# Patient Record
Sex: Male | Born: 1950 | Race: Black or African American | Hispanic: No | Marital: Married | Smoking: Current every day smoker
Health system: Southern US, Community
[De-identification: ages and names within clinical notes are randomized; demographics above are authoritative.]

## PROBLEM LIST (undated history)

## (undated) DIAGNOSIS — J45909 Unspecified asthma, uncomplicated: Secondary | ICD-10-CM

## (undated) DIAGNOSIS — I10 Essential (primary) hypertension: Secondary | ICD-10-CM

## (undated) DIAGNOSIS — I639 Cerebral infarction, unspecified: Secondary | ICD-10-CM

## (undated) DIAGNOSIS — J449 Chronic obstructive pulmonary disease, unspecified: Secondary | ICD-10-CM

## (undated) HISTORY — PX: LEG SURGERY: SHX1003

---

## 2006-02-17 ENCOUNTER — Emergency Department: Payer: Self-pay | Admitting: Emergency Medicine

## 2008-08-25 ENCOUNTER — Emergency Department: Payer: Self-pay | Admitting: Emergency Medicine

## 2009-12-29 ENCOUNTER — Inpatient Hospital Stay (HOSPITAL_COMMUNITY)
Admission: EM | Admit: 2009-12-29 | Discharge: 2010-01-27 | Payer: Self-pay | Source: Home / Self Care | Admitting: Emergency Medicine

## 2009-12-30 ENCOUNTER — Ambulatory Visit: Admission: RE | Admit: 2009-12-30 | Discharge: 2009-12-30 | Payer: Self-pay | Admitting: Neurology

## 2009-12-30 ENCOUNTER — Ambulatory Visit: Payer: Self-pay | Admitting: Critical Care Medicine

## 2009-12-31 ENCOUNTER — Ambulatory Visit: Payer: Self-pay | Admitting: Vascular Surgery

## 2009-12-31 ENCOUNTER — Encounter (INDEPENDENT_AMBULATORY_CARE_PROVIDER_SITE_OTHER): Payer: Self-pay | Admitting: Internal Medicine

## 2010-01-02 ENCOUNTER — Ambulatory Visit: Payer: Self-pay | Admitting: Physical Medicine & Rehabilitation

## 2010-05-21 ENCOUNTER — Emergency Department: Payer: Self-pay | Admitting: Emergency Medicine

## 2010-06-01 ENCOUNTER — Emergency Department: Payer: Self-pay | Admitting: Emergency Medicine

## 2010-06-15 LAB — CBC
HCT: 32.6 % — ABNORMAL LOW (ref 39.0–52.0)
HCT: 40.4 % (ref 39.0–52.0)
Hemoglobin: 11.1 g/dL — ABNORMAL LOW (ref 13.0–17.0)
Hemoglobin: 11.4 g/dL — ABNORMAL LOW (ref 13.0–17.0)
Hemoglobin: 12.7 g/dL — ABNORMAL LOW (ref 13.0–17.0)
Hemoglobin: 14.1 g/dL (ref 13.0–17.0)
Hemoglobin: 14 g/dL (ref 13.0–17.0)
MCH: 31 pg (ref 26.0–34.0)
MCH: 31.3 pg (ref 26.0–34.0)
MCH: 31.4 pg (ref 26.0–34.0)
MCH: 31.9 pg (ref 26.0–34.0)
MCH: 32 pg (ref 26.0–34.0)
MCH: 31.6 pg (ref 26.0–34.0)
MCHC: 33.2 g/dL (ref 30.0–36.0)
MCHC: 34 g/dL (ref 30.0–36.0)
MCHC: 34 g/dL (ref 30.0–36.0)
MCHC: 34.3 g/dL (ref 30.0–36.0)
MCHC: 34.9 g/dL (ref 30.0–36.0)
MCV: 91.2 fL (ref 78.0–100.0)
MCV: 92.1 fL (ref 78.0–100.0)
Platelets: 174 10*3/uL (ref 150–400)
Platelets: 264 10*3/uL (ref 150–400)
Platelets: 289 10*3/uL (ref 150–400)
Platelets: 189 10*3/uL (ref 150–400)
RBC: 3.99 MIL/uL — ABNORMAL LOW (ref 4.22–5.81)
RBC: 5.26 MIL/uL (ref 4.22–5.81)
RBC: 4.43 MIL/uL (ref 4.22–5.81)
RDW: 12.7 % (ref 11.5–15.5)
RDW: 13.3 % (ref 11.5–15.5)
RDW: 14 % (ref 11.5–15.5)
WBC: 18.2 10*3/uL — ABNORMAL HIGH (ref 4.0–10.5)
WBC: 9.2 10*3/uL (ref 4.0–10.5)

## 2010-06-15 LAB — COMPREHENSIVE METABOLIC PANEL
ALT: 18 U/L (ref 0–53)
ALT: 19 U/L (ref 0–53)
AST: 23 U/L (ref 0–37)
AST: 24 U/L (ref 0–37)
AST: 26 U/L (ref 0–37)
Albumin: 3.7 g/dL (ref 3.5–5.2)
Albumin: 3.9 g/dL (ref 3.5–5.2)
Alkaline Phosphatase: 67 U/L (ref 39–117)
CO2: 22 mEq/L (ref 19–32)
CO2: 28 mEq/L (ref 19–32)
Calcium: 8.7 mg/dL (ref 8.4–10.5)
Chloride: 109 mEq/L (ref 96–112)
Chloride: 113 mEq/L — ABNORMAL HIGH (ref 96–112)
Creatinine, Ser: 1.06 mg/dL (ref 0.4–1.5)
Creatinine, Ser: 1.07 mg/dL (ref 0.4–1.5)
Creatinine, Ser: 1.31 mg/dL (ref 0.4–1.5)
GFR calc Af Amer: >60 mL/min (ref >60)
GFR calc Af Amer: >60 mL/min (ref >60)
GFR calc non Af Amer: 56 mL/min — ABNORMAL LOW (ref >60)
GFR calc non Af Amer: >60 mL/min (ref >60)
Glucose, Bld: 87 mg/dL (ref 70–99)
Potassium: 3.6 mEq/L (ref 3.5–5.1)
Potassium: 4.2 mEq/L (ref 3.5–5.1)
Sodium: 141 mEq/L (ref 135–145)
Sodium: 143 mEq/L (ref 135–145)
Total Bilirubin: 0.7 mg/dL (ref 0.3–1.2)
Total Bilirubin: 1 mg/dL (ref 0.3–1.2)
Total Protein: 6.4 g/dL (ref 6.0–8.3)

## 2010-06-15 LAB — URINE CULTURE: Culture: NO GROWTH

## 2010-06-15 LAB — SODIUM
Sodium: 142 mEq/L (ref 135–145)
Sodium: 143 mEq/L (ref 135–145)
Sodium: 146 mEq/L — ABNORMAL HIGH (ref 135–145)
Sodium: 149 mEq/L — ABNORMAL HIGH (ref 135–145)
Sodium: 150 mEq/L — ABNORMAL HIGH (ref 135–145)
Sodium: 151 mEq/L — ABNORMAL HIGH (ref 135–145)
Sodium: 152 mEq/L — ABNORMAL HIGH (ref 135–145)
Sodium: 154 mEq/L — ABNORMAL HIGH (ref 135–145)
Sodium: 154 mEq/L — ABNORMAL HIGH (ref 135–145)
Sodium: 155 mEq/L — ABNORMAL HIGH (ref 135–145)
Sodium: 155 mEq/L — ABNORMAL HIGH (ref 135–145)
Sodium: 155 mEq/L — ABNORMAL HIGH (ref 135–145)
Sodium: 158 mEq/L — ABNORMAL HIGH (ref 135–145)
Sodium: 162 mEq/L (ref 135–145)
Sodium: 164 mEq/L (ref 135–145)

## 2010-06-15 LAB — GLUCOSE, CAPILLARY
Glucose-Capillary: 108 mg/dL — ABNORMAL HIGH (ref 70–99)
Glucose-Capillary: 111 mg/dL — ABNORMAL HIGH (ref 70–99)
Glucose-Capillary: 111 mg/dL — ABNORMAL HIGH (ref 70–99)
Glucose-Capillary: 112 mg/dL — ABNORMAL HIGH (ref 70–99)
Glucose-Capillary: 113 mg/dL — ABNORMAL HIGH (ref 70–99)
Glucose-Capillary: 118 mg/dL — ABNORMAL HIGH (ref 70–99)
Glucose-Capillary: 121 mg/dL — ABNORMAL HIGH (ref 70–99)
Glucose-Capillary: 123 mg/dL — ABNORMAL HIGH (ref 70–99)
Glucose-Capillary: 123 mg/dL — ABNORMAL HIGH (ref 70–99)
Glucose-Capillary: 124 mg/dL — ABNORMAL HIGH (ref 70–99)
Glucose-Capillary: 125 mg/dL — ABNORMAL HIGH (ref 70–99)
Glucose-Capillary: 128 mg/dL — ABNORMAL HIGH (ref 70–99)
Glucose-Capillary: 129 mg/dL — ABNORMAL HIGH (ref 70–99)
Glucose-Capillary: 132 mg/dL — ABNORMAL HIGH (ref 70–99)
Glucose-Capillary: 133 mg/dL — ABNORMAL HIGH (ref 70–99)
Glucose-Capillary: 134 mg/dL — ABNORMAL HIGH (ref 70–99)
Glucose-Capillary: 134 mg/dL — ABNORMAL HIGH (ref 70–99)
Glucose-Capillary: 143 mg/dL — ABNORMAL HIGH (ref 70–99)
Glucose-Capillary: 145 mg/dL — ABNORMAL HIGH (ref 70–99)
Glucose-Capillary: 150 mg/dL — ABNORMAL HIGH (ref 70–99)
Glucose-Capillary: 150 mg/dL — ABNORMAL HIGH (ref 70–99)
Glucose-Capillary: 154 mg/dL — ABNORMAL HIGH (ref 70–99)
Glucose-Capillary: 90 mg/dL (ref 70–99)
Glucose-Capillary: 141 mg/dL — ABNORMAL HIGH (ref 70–99)
Glucose-Capillary: 89 mg/dL (ref 70–99)
Glucose-Capillary: 94 mg/dL (ref 70–99)
Glucose-Capillary: 94 mg/dL (ref 70–99)

## 2010-06-15 LAB — BASIC METABOLIC PANEL
BUN: 22 mg/dL (ref 6–23)
CO2: 22 mEq/L (ref 19–32)
CO2: 23 mEq/L (ref 19–32)
CO2: 23 mEq/L (ref 19–32)
CO2: 23 mEq/L (ref 19–32)
CO2: 25 mEq/L (ref 19–32)
Calcium: 8 mg/dL — ABNORMAL LOW (ref 8.4–10.5)
Calcium: 8.2 mg/dL — ABNORMAL LOW (ref 8.4–10.5)
Calcium: 8.3 mg/dL — ABNORMAL LOW (ref 8.4–10.5)
Calcium: 8.3 mg/dL — ABNORMAL LOW (ref 8.4–10.5)
Calcium: 8.5 mg/dL (ref 8.4–10.5)
Calcium: 8.5 mg/dL (ref 8.4–10.5)
Chloride: 122 mEq/L — ABNORMAL HIGH (ref 96–112)
Chloride: 126 mEq/L — ABNORMAL HIGH (ref 96–112)
Creatinine, Ser: 1.01 mg/dL (ref 0.4–1.5)
Creatinine, Ser: 1.05 mg/dL (ref 0.4–1.5)
Creatinine, Ser: 1.34 mg/dL (ref 0.4–1.5)
GFR calc Af Amer: >60 mL/min (ref >60)
GFR calc Af Amer: >60 mL/min (ref >60)
GFR calc Af Amer: >60 mL/min (ref >60)
GFR calc non Af Amer: >60 mL/min (ref >60)
GFR calc non Af Amer: >60 mL/min (ref >60)
Glucose, Bld: 126 mg/dL — ABNORMAL HIGH (ref 70–99)
Glucose, Bld: 134 mg/dL — ABNORMAL HIGH (ref 70–99)
Glucose, Bld: 140 mg/dL — ABNORMAL HIGH (ref 70–99)
Glucose, Bld: 164 mg/dL — ABNORMAL HIGH (ref 70–99)
Potassium: 3.1 mEq/L — ABNORMAL LOW (ref 3.5–5.1)
Sodium: 145 mEq/L (ref 135–145)
Sodium: 152 mEq/L — ABNORMAL HIGH (ref 135–145)
Sodium: 157 mEq/L — ABNORMAL HIGH (ref 135–145)
Sodium: 157 mEq/L — ABNORMAL HIGH (ref 135–145)

## 2010-06-15 LAB — BLOOD GAS, ARTERIAL
Acid-base deficit: 1.2 mmol/L (ref 0.0–2.0)
Bicarbonate: 22.1 mEq/L (ref 20.0–24.0)
Drawn by: 296031
FIO2: 35 %
O2 Saturation: 95.9 %
TCO2: 23.1 mmol/L (ref 0–100)
pCO2 arterial: 31.9 mmHg — ABNORMAL LOW (ref 35.0–45.0)
pO2, Arterial: 75.9 mmHg — ABNORMAL LOW (ref 80.0–100.0)

## 2010-06-15 LAB — DIFFERENTIAL
Basophils Absolute: 0.2 10*3/uL — ABNORMAL HIGH (ref 0.0–0.1)
Basophils Absolute: 0 10*3/uL (ref 0.0–0.1)
Basophils Relative: 0 % (ref 0–1)
Eosinophils Absolute: 0.3 10*3/uL (ref 0.0–0.7)
Eosinophils Relative: 1 % (ref 0–5)
Eosinophils Relative: 1 % (ref 0–5)
Eosinophils Relative: 3 % (ref 0–5)
Lymphs Abs: 1.8 10*3/uL (ref 0.7–4.0)
Monocytes Absolute: 1.4 10*3/uL — ABNORMAL HIGH (ref 0.1–1.0)
Monocytes Absolute: 1.5 10*3/uL — ABNORMAL HIGH (ref 0.1–1.0)
Monocytes Relative: 8 % (ref 3–12)
Neutrophils Relative %: 72 % (ref 43–77)
Neutrophils Relative %: 58 % (ref 43–77)
WBC Morphology: INCREASED

## 2010-06-15 LAB — URINALYSIS, ROUTINE W REFLEX MICROSCOPIC
Leukocytes, UA: NEGATIVE
Nitrite: NEGATIVE
Nitrite: NEGATIVE
Protein, ur: NEGATIVE mg/dL
Specific Gravity, Urine: 1.012 (ref 1.005–1.030)
Specific Gravity, Urine: 1.007 (ref 1.005–1.030)
Urobilinogen, UA: 0.2 mg/dL (ref 0.0–1.0)
pH: 7.5 (ref 5.0–8.0)

## 2010-06-15 LAB — POCT I-STAT 3, ART BLOOD GAS (G3+)
Acid-Base Excess: 1 mmol/L (ref 0.0–2.0)
Bicarbonate: 21.9 mEq/L (ref 20.0–24.0)
pCO2 arterial: 27.3 mmHg — ABNORMAL LOW (ref 35.0–45.0)
pH, Arterial: 7.513 — ABNORMAL HIGH (ref 7.350–7.450)
pO2, Arterial: 79 mmHg — ABNORMAL LOW (ref 80.0–100.0)

## 2010-06-15 LAB — LIPID PANEL
HDL: 48 mg/dL (ref >39)
Total CHOL/HDL Ratio: 4 RATIO
Triglycerides: 146 mg/dL (ref <150)

## 2010-06-15 LAB — PATHOLOGIST SMEAR REVIEW

## 2010-06-15 LAB — MRSA PCR SCREENING: MRSA by PCR: NEGATIVE

## 2010-06-15 LAB — CULTURE, BLOOD (ROUTINE X 2)
Culture  Setup Time: 201110021150
Culture: NO GROWTH
Culture: NO GROWTH
Culture: NO GROWTH

## 2010-06-15 LAB — VITAMIN B12: Vitamin B-12: 731 pg/mL (ref 211–911)

## 2010-06-15 LAB — RAPID URINE DRUG SCREEN, HOSP PERFORMED
Amphetamines: NOT DETECTED
Benzodiazepines: NOT DETECTED
Tetrahydrocannabinol: NOT DETECTED

## 2010-06-15 LAB — URINE MICROSCOPIC-ADD ON

## 2010-06-15 LAB — CK TOTAL AND CKMB (NOT AT ARMC)
CK, MB: 4.9 ng/mL — ABNORMAL HIGH (ref 0.3–4.0)
Total CK: 346 U/L — ABNORMAL HIGH (ref 7–232)

## 2010-06-15 LAB — PROTIME-INR
INR: 1.04 (ref 0.00–1.49)
Prothrombin Time: 13.8 seconds (ref 11.6–15.2)

## 2010-06-15 LAB — MAGNESIUM: Magnesium: 2.2 mg/dL (ref 1.5–2.5)

## 2010-06-15 LAB — PHOSPHORUS: Phosphorus: 1.3 mg/dL — ABNORMAL LOW (ref 2.3–4.6)

## 2010-06-17 ENCOUNTER — Emergency Department: Payer: Self-pay | Admitting: Emergency Medicine

## 2010-07-13 ENCOUNTER — Emergency Department: Payer: Self-pay | Admitting: Emergency Medicine

## 2010-07-20 ENCOUNTER — Emergency Department: Payer: Self-pay | Admitting: Emergency Medicine

## 2010-08-14 ENCOUNTER — Emergency Department: Payer: Self-pay | Admitting: Emergency Medicine

## 2010-08-28 ENCOUNTER — Emergency Department: Payer: Self-pay | Admitting: Emergency Medicine

## 2010-09-16 ENCOUNTER — Emergency Department: Payer: Self-pay | Admitting: Emergency Medicine

## 2010-10-03 ENCOUNTER — Emergency Department: Payer: Self-pay | Admitting: Unknown Physician Specialty

## 2010-11-03 ENCOUNTER — Emergency Department: Payer: Self-pay | Admitting: Unknown Physician Specialty

## 2010-12-01 ENCOUNTER — Emergency Department: Payer: Self-pay | Admitting: Emergency Medicine

## 2010-12-23 ENCOUNTER — Emergency Department: Payer: Self-pay | Admitting: Emergency Medicine

## 2011-01-05 ENCOUNTER — Emergency Department: Payer: Self-pay | Admitting: Emergency Medicine

## 2011-10-07 IMAGING — CR DG CHEST 1V PORT
1 series · 1 of 1 positions shown · non-contrast
Comparison: none

REASON FOR EXAM: sob
COMMENTS:

PROCEDURE:     DXR - DXR PORTABLE CHEST SINGLE VIEW  - June 01, 2010 [DATE]
RESULT:     Comparison is made to the prior exam of 05/21/2010.
The lung fields are clear. The heart, mediastinal and osseous structures
show no acute changes. Monitoring electrodes are present.

[view not recorded]
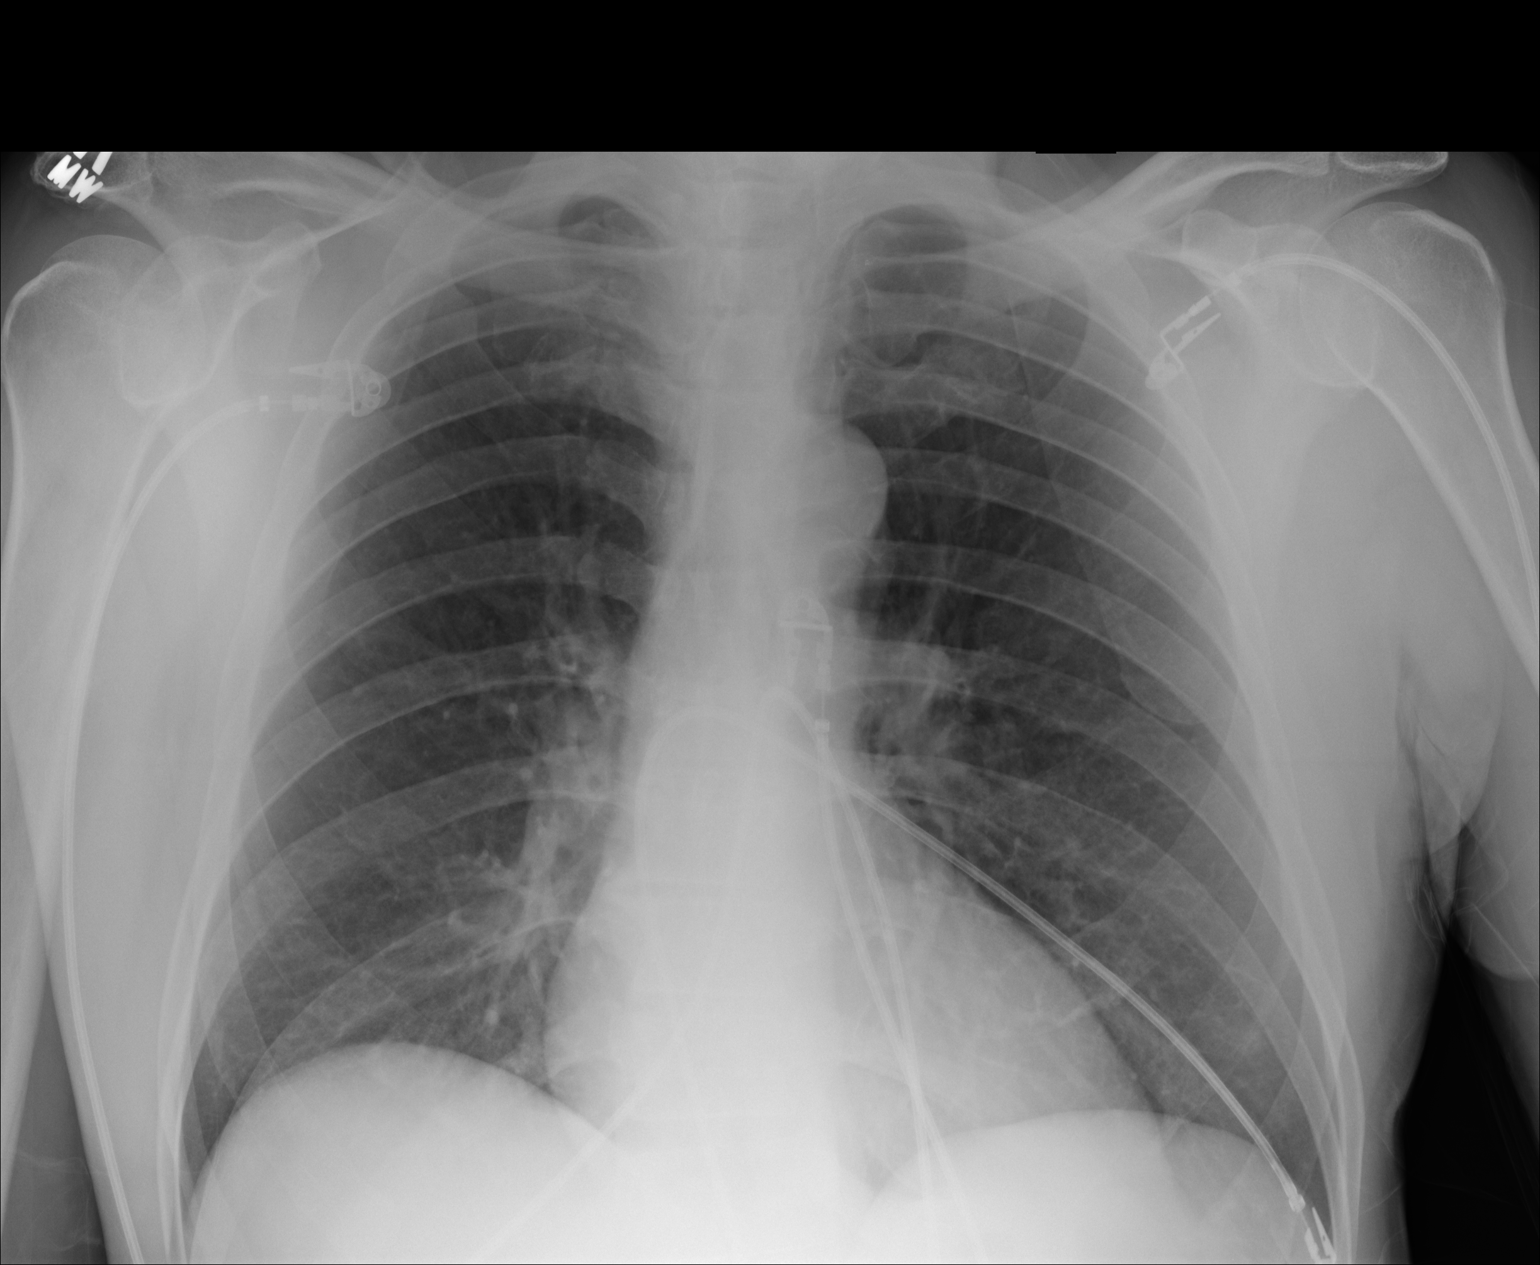

[1 of 1 positions shown; findings below may reference images not displayed]

IMPRESSION: No acute changes are identified.

## 2011-10-23 IMAGING — CR DG CHEST 1V PORT
1 series · 1 of 1 positions shown · non-contrast
Comparison: none

REASON FOR EXAM: sobb
COMMENTS:

[view not recorded]
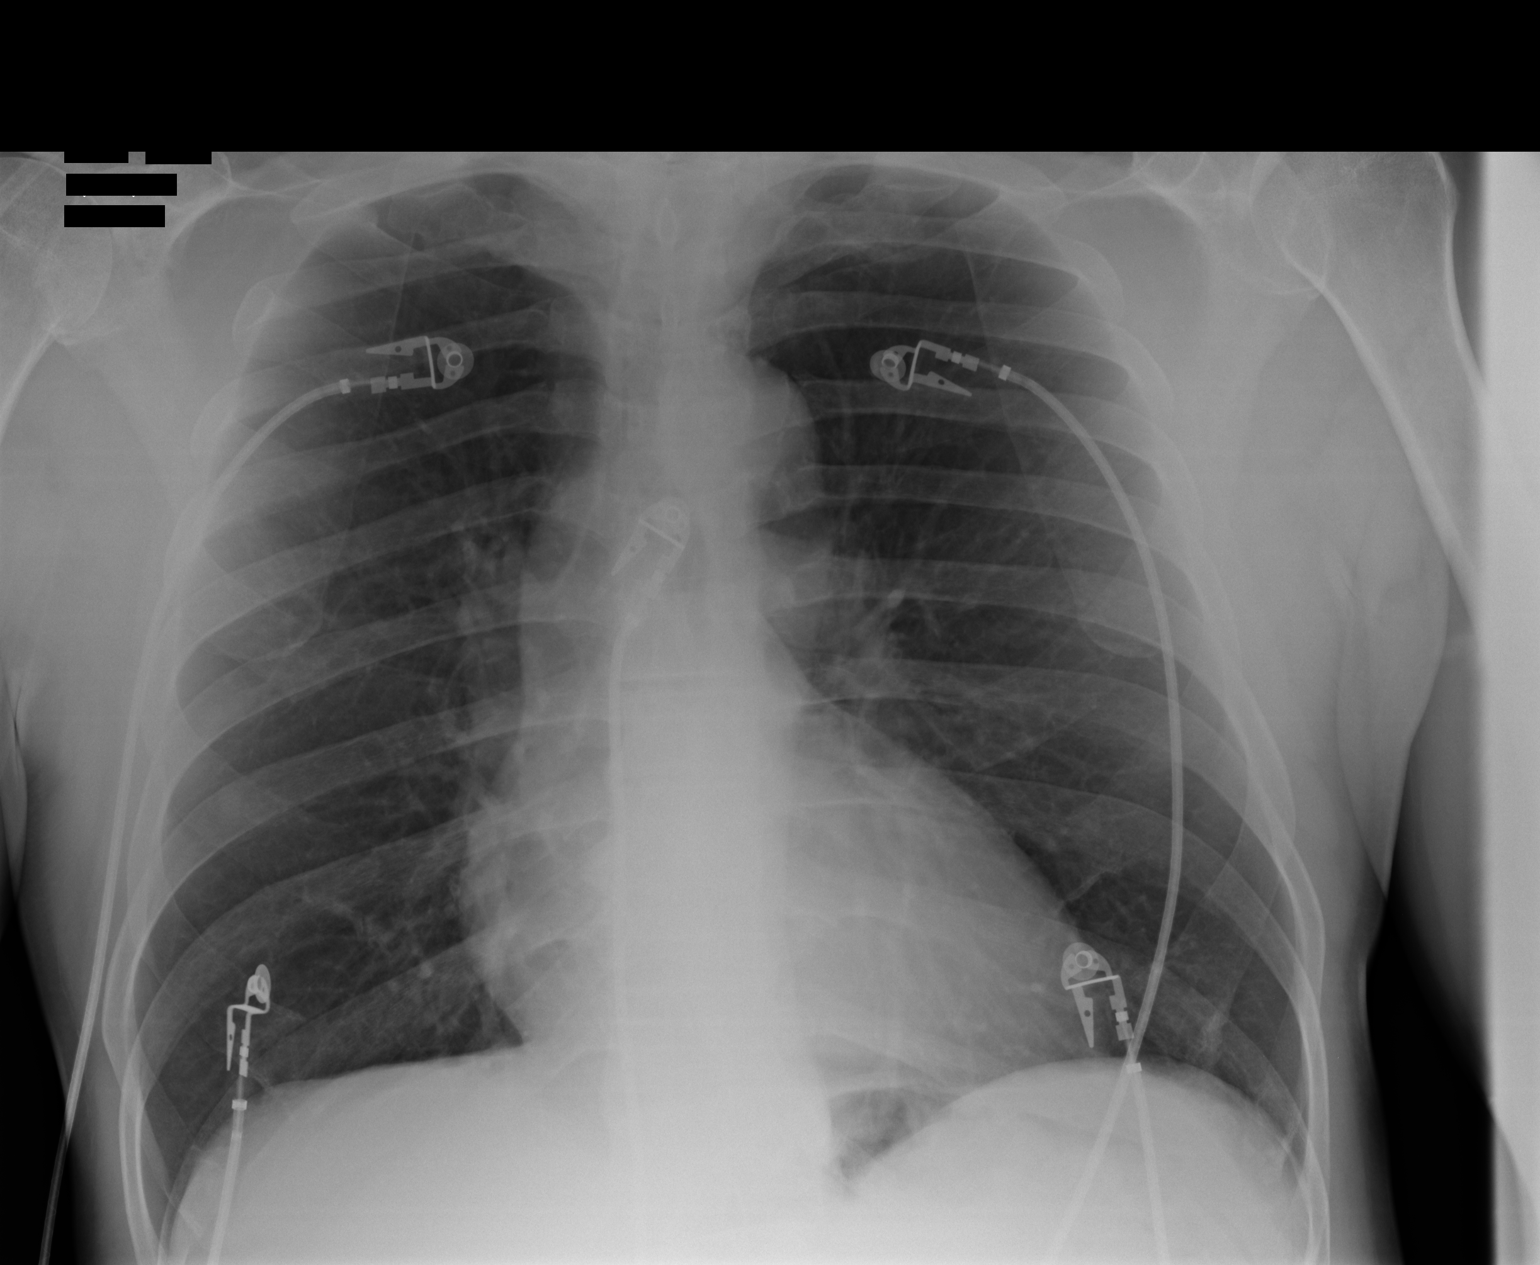

[1 of 1 positions shown; findings below may reference images not displayed]

PROCEDURE:     DXR - DXR PORTABLE CHEST SINGLE VIEW  - June 17, 2010  [DATE]

RESULT:     Comparison is made to previous exam dated 06/01/2010.

Cardiac monitoring electrodes are present. There is mild hyperinflation
aerated The lungs are clear. The heart and pulmonary vessels are normal. The
bony and mediastinal structures are unremarkable. There is no effusion.
There is no pneumothorax or evidence of congestive failure.
IMPRESSION: No acute cardiopulmonary disease. Stable appearance. There
is hyperinflation raising the possibility of COPD.

## 2012-04-07 IMAGING — CR DG CHEST 2V
1 series · 2 of 2 positions shown · non-contrast
Comparison: none

REASON FOR EXAM: Shortness of Breath
COMMENTS:   May transport without cardiac monitor

[Series 1: view not recorded · 0.17mm/px · 2 of 2 slices shown]
[im 1/2]
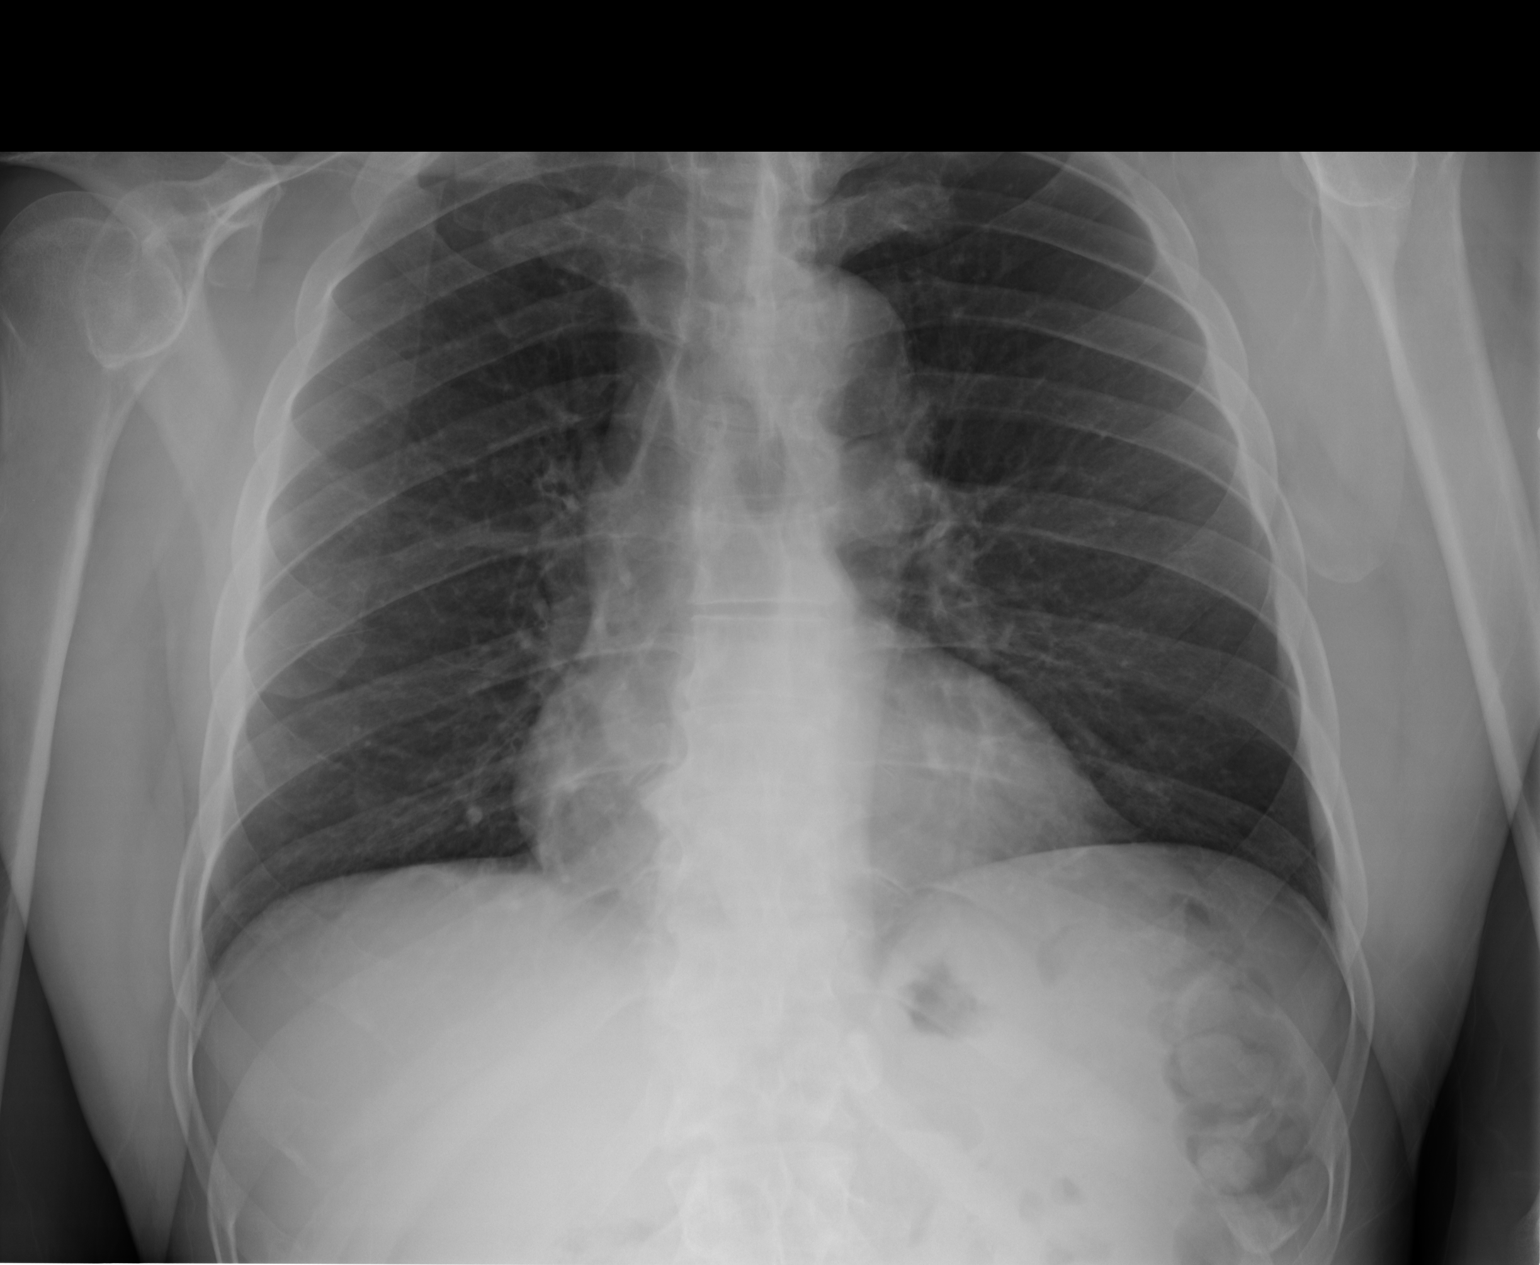
[im 2/2]
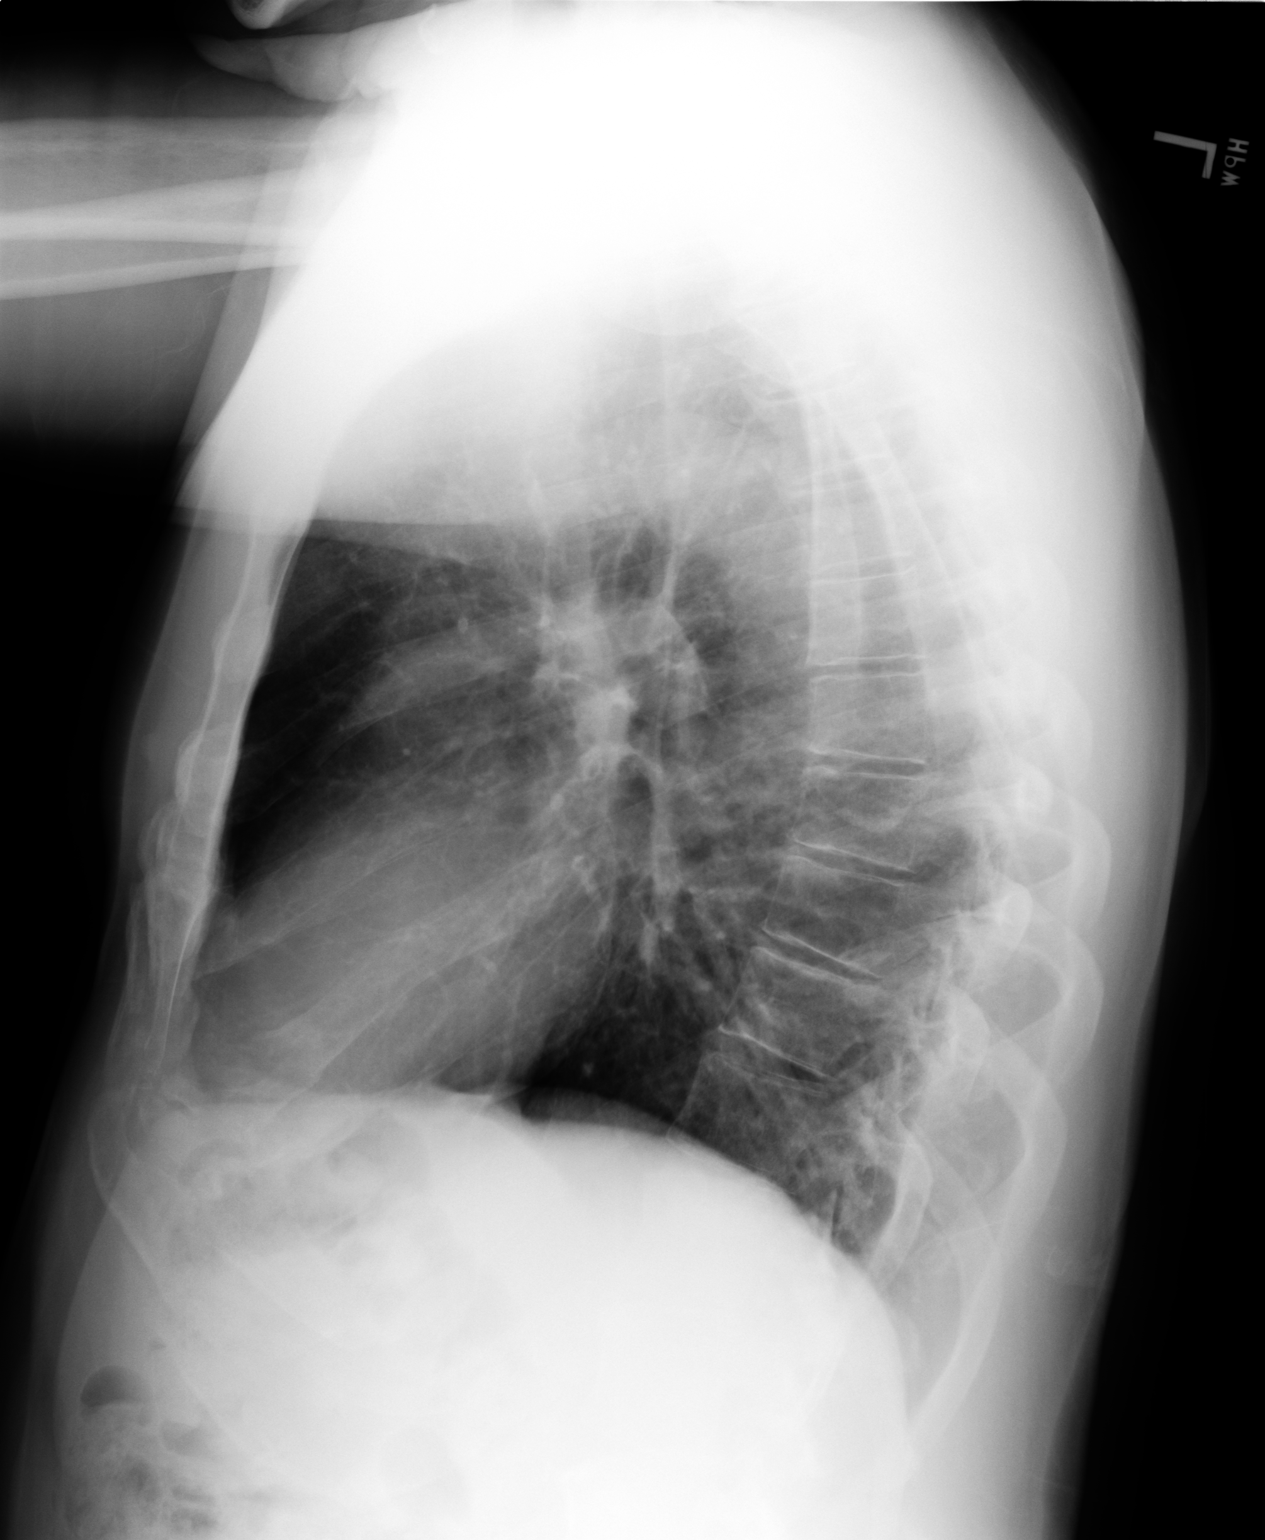

[2 of 2 positions shown; findings below may reference images not displayed]

PROCEDURE:     DXR - DXR CHEST PA (OR AP) AND LATERAL  - December 01, 2010 [DATE]

RESULT:     Comparison is made to the prior exam of 09/16/2010. The lung
fields are clear. No pneumonia, pneumothorax or pleural effusion is seen.
Heart size is normal. The mediastinal and osseous structures show no
significant abnormalities.
IMPRESSION: No acute changes are identified.

## 2012-10-27 ENCOUNTER — Emergency Department: Payer: Self-pay | Admitting: Emergency Medicine

## 2012-10-27 LAB — COMPREHENSIVE METABOLIC PANEL
Albumin: 3.9 g/dL (ref 3.4–5.0)
Alkaline Phosphatase: 71 U/L (ref 50–136)
Anion Gap: 4 — ABNORMAL LOW (ref 7–16)
BUN: 12 mg/dL (ref 7–18)
Bilirubin,Total: 0.6 mg/dL (ref 0.2–1.0)
Calcium, Total: 8.9 mg/dL (ref 8.5–10.1)
Chloride: 109 mmol/L — ABNORMAL HIGH (ref 98–107)
Creatinine: 1.15 mg/dL (ref 0.60–1.30)
EGFR (African American): >60
Glucose: 70 mg/dL (ref 65–99)
Potassium: 3.7 mmol/L (ref 3.5–5.1)
Total Protein: 8 g/dL (ref 6.4–8.2)

## 2012-10-27 LAB — CBC
HGB: 15.4 g/dL (ref 13.0–18.0)
MCH: 30.9 pg (ref 26.0–34.0)
MCHC: 34.6 g/dL (ref 32.0–36.0)
RBC: 4.98 10*6/uL (ref 4.40–5.90)
RDW: 15.3 % — ABNORMAL HIGH (ref 11.5–14.5)
WBC: 7.8 10*3/uL (ref 3.8–10.6)

## 2012-12-23 ENCOUNTER — Inpatient Hospital Stay: Payer: Self-pay | Admitting: Internal Medicine

## 2012-12-23 LAB — TROPONIN I: Troponin-I: <0.02 ng/mL

## 2012-12-23 LAB — CBC
HGB: 17 g/dL (ref 13.0–18.0)
MCH: 30.9 pg (ref 26.0–34.0)
Platelet: 253 10*3/uL (ref 150–440)
RBC: 5.51 10*6/uL (ref 4.40–5.90)
RDW: 13.9 % (ref 11.5–14.5)
WBC: 12.4 10*3/uL — ABNORMAL HIGH (ref 3.8–10.6)

## 2012-12-23 LAB — BASIC METABOLIC PANEL
Anion Gap: 3 — ABNORMAL LOW (ref 7–16)
Calcium, Total: 9 mg/dL (ref 8.5–10.1)
Chloride: 107 mmol/L (ref 98–107)
Co2: 30 mmol/L (ref 21–32)
Glucose: 108 mg/dL — ABNORMAL HIGH (ref 65–99)
Sodium: 140 mmol/L (ref 136–145)

## 2012-12-24 LAB — CBC WITH DIFFERENTIAL/PLATELET
Eosinophil #: 0 10*3/uL (ref 0.0–0.7)
Eosinophil %: 0 %
HCT: 43.9 % (ref 40.0–52.0)
HGB: 14.7 g/dL (ref 13.0–18.0)
Lymphocyte #: 1.4 10*3/uL (ref 1.0–3.6)
Monocyte #: 0.7 x10 3/mm (ref 0.2–1.0)
Monocyte %: 2.7 %
Neutrophil #: 24.8 10*3/uL — ABNORMAL HIGH (ref 1.4–6.5)
Platelet: 240 10*3/uL (ref 150–440)
RBC: 4.87 10*6/uL (ref 4.40–5.90)
RDW: 14.1 % (ref 11.5–14.5)
WBC: 27 10*3/uL — ABNORMAL HIGH (ref 3.8–10.6)

## 2012-12-24 LAB — BASIC METABOLIC PANEL
BUN: 27 mg/dL — ABNORMAL HIGH (ref 7–18)
Calcium, Total: 8.9 mg/dL (ref 8.5–10.1)
Chloride: 106 mmol/L (ref 98–107)
Co2: 24 mmol/L (ref 21–32)
EGFR (African American): >60
EGFR (Non-African Amer.): 56 — ABNORMAL LOW
Glucose: 148 mg/dL — ABNORMAL HIGH (ref 65–99)
Osmolality: 282 (ref 275–301)
Potassium: 3.7 mmol/L (ref 3.5–5.1)
Sodium: 137 mmol/L (ref 136–145)

## 2012-12-24 LAB — HEMOGLOBIN A1C: Hemoglobin A1C: 5.8 % (ref 4.2–6.3)

## 2013-04-02 ENCOUNTER — Emergency Department: Payer: Self-pay | Admitting: Emergency Medicine

## 2013-04-03 LAB — CBC
HCT: 42.7 % (ref 40.0–52.0)
HGB: 14.5 g/dL (ref 13.0–18.0)
MCH: 30.4 pg (ref 26.0–34.0)
MCHC: 33.9 g/dL (ref 32.0–36.0)
MCV: 90 fL (ref 80–100)
Platelet: 177 10*3/uL (ref 150–440)
RBC: 4.77 10*6/uL (ref 4.40–5.90)
RDW: 14.1 % (ref 11.5–14.5)
WBC: 8.4 10*3/uL (ref 3.8–10.6)

## 2013-04-03 LAB — BASIC METABOLIC PANEL
Anion Gap: 6 — ABNORMAL LOW (ref 7–16)
BUN: 21 mg/dL — ABNORMAL HIGH (ref 7–18)
CO2: 28 mmol/L (ref 21–32)
CREATININE: 1.18 mg/dL (ref 0.60–1.30)
Calcium, Total: 8.6 mg/dL (ref 8.5–10.1)
Chloride: 108 mmol/L — ABNORMAL HIGH (ref 98–107)
EGFR (African American): >60
EGFR (Non-African Amer.): >60
GLUCOSE: 137 mg/dL — AB (ref 65–99)
OSMOLALITY: 288 (ref 275–301)
Potassium: 3.2 mmol/L — ABNORMAL LOW (ref 3.5–5.1)
Sodium: 142 mmol/L (ref 136–145)

## 2013-04-03 LAB — TROPONIN I: Troponin-I: <0.02 ng/mL

## 2013-04-26 ENCOUNTER — Emergency Department: Payer: Self-pay | Admitting: Emergency Medicine

## 2013-04-26 LAB — BASIC METABOLIC PANEL
Anion Gap: 2 — ABNORMAL LOW (ref 7–16)
BUN: 15 mg/dL (ref 7–18)
CREATININE: 1.14 mg/dL (ref 0.60–1.30)
Calcium, Total: 8.6 mg/dL (ref 8.5–10.1)
Chloride: 112 mmol/L — ABNORMAL HIGH (ref 98–107)
Co2: 29 mmol/L (ref 21–32)
EGFR (African American): >60
Glucose: 77 mg/dL (ref 65–99)
Osmolality: 285 (ref 275–301)
Potassium: 3.9 mmol/L (ref 3.5–5.1)
SODIUM: 143 mmol/L (ref 136–145)

## 2013-04-26 LAB — CBC
HCT: 42.2 % (ref 40.0–52.0)
HGB: 14.1 g/dL (ref 13.0–18.0)
MCH: 30.7 pg (ref 26.0–34.0)
MCHC: 33.4 g/dL (ref 32.0–36.0)
MCV: 92 fL (ref 80–100)
Platelet: 195 10*3/uL (ref 150–440)
RBC: 4.59 10*6/uL (ref 4.40–5.90)
RDW: 13.9 % (ref 11.5–14.5)
WBC: 6.7 10*3/uL (ref 3.8–10.6)

## 2013-04-26 LAB — TROPONIN I

## 2014-07-23 NOTE — Discharge Summary (Signed)
PATIENT NAME:  Roger Wilkerson, Roger Wilkerson MR#:  [id number] DATE OF BIRTH:  September 27, 1950  PRIMARY CARE PHYSICIAN: Evelene CroonMeindert Niemeyer, MD.  FINAL DIAGNOSES:  1.  Systemic inflammatory response syndrome with tachypnea and leukocytosis.  2.  Asthma exacerbation.  3.  Hypertension.  4.  History of cerebrovascular accident.   I advised him to stop labetalol.   MEDICATIONS ON DISCHARGE: Include amlodipine 10 mg daily; aspirin 325 mg daily; prednisone taper 5 mg six tablets day one, 5 tablets day two, 4 tablets day three, 3 tablets day 4, two tablets day 5, one tablet days six and seven; Spiriva 1 inhalation daily; DuoNeb nebulizer solution 3 mL every six hours as needed for shortness of breath; Proventil HFA 1 puff every 6  hours as needed for wheezing or cough; Zithromax 250 mg one tablet daily for 3 more days;  nicotine patch 21 mg per chest wall daily; Flovent HFA 2 puffs twice Wilkerson day 110 mcg/inhalation;  lisinopril 10 mg daily; Pravachol 20 mg at bedtime.   HOME OXYGEN: No.   DIET: Low-sodium diet, regular consistency.   ACTIVITY: As tolerated.   CHIEF COMPLAINT: Shortness of breath. The patient was admitted with systemic inflammatory response syndrome with asthma exacerbation.   LABORATORY AND RADIOLOGICAL DATA DURING THE HOSPITAL COURSE: Included white blood cell count 12.4, H and H 17.0 and 49.5, platelet count of 253. Glucose 108, BUN 19, creatinine 1.3, sodium 140, potassium 4.1, chloride 107, CO2 of 30, GFR 58. Troponin negative.   EKG: Normal sinus rhythm, left atrial enlargement.   CHEST X-RAY: No acute abnormality.   Hemoglobin A1c 5.8. Creatinine upon discharge 1.34. White count went up to 27 with the IV steroids.   HOSPITAL COURSE PER PROBLEM LIST:  1.  For the systemic inflammatory response syndrome, the patient did have leukocytosis on admission. It went up with the IV steroids. Recommend checking Wilkerson CBC as outpatient.  2.  Asthma exacerbation- the patients Respiratory rate had come  down, 18 upon discharge. The patient's other vital signs were normal. The patient after ambulation, pulse oximetry 92%.  3.  For the patient's asthma exacerbation, the patient was started on high-dose IV Solu-Medrol. The patient's major issue is he does not have his inhalers at home. I did write him scripts of Spiriva, DuoNeb, Proventil and Flovent. He will be sent home on Zithromax and Wilkerson prednisone taper. He did have some wheeze upon discharge but was feeling much better upon discharge then on presentation.  4.  Hypertension. I stopped his labetalol secondary to wheeze. Prescribed lisinopril instead, and he will also be on amlodipine.  5.  With his history of CVA, he is on aspirin. I did add Pravachol. Every patient with history of stroke should be on Wilkerson cholesterol medication. Follow up with Dr. Lacie ScottsNiemeyer for this.  6.  For the patient's tobacco abuse, smoking cessation counseling was done on admission. Nicotine patch applied.   TIME SPENT ON DISCHARGE: 35 minutes.    ____________________________ Herschell Dimesichard J. Renae GlossWieting, MD rjw:np D: 12/24/2012 16:18:57 ET T: 12/24/2012 20:55:02 ET JOB#: [id number]  cc: Herschell Dimesichard J. Renae GlossWieting, MD, <Dictator> Meindert Wilkerson. Lacie ScottsNiemeyer, MD Salley ScarletICHARD J Mykala Mccready MD ELECTRONICALLY SIGNED 12/25/2012 14:47

## 2014-07-23 NOTE — H&P (Signed)
PATIENT NAME:  Roger Wilkerson, Roger Wilkerson MR#:  [id number] DATE OF BIRTH:  11-15-1950  DATE OF ADMISSION:  12/23/2012  REFERRING PHYSICIAN: Dr. Mayford Knife  PRIMARY CARE PHYSICIAN: Dr. Lacie Scotts  CHIEF COMPLAINT: Shortness of breath.   HISTORY OF PRESENT ILLNESS: The patient is a pleasant 64 year old African American male with history of stroke with right-sided residual hemiparesis, hypertension and asthma who initially had some shortness of breath and exacerbation of his asthma a few weeks ago. It got better with treatment, however, his symptoms recurred and got worse over the last week. It acutely got worse earlier today where he could not breathe. He had wheezing. He has had no fevers or chills, but has a cough which is at times productive. He came in here through EMS and was given several nebs. Initially, there were going to be discharging him, but his shortness of breath persisted and worsened and hospitalist services was contacted for further evaluation and management. Of note, he did have bouts of hypoxia to 91 on room air. Therefore, he was asked to be admitted by [place].   PAST MEDICAL HISTORY: Stroke with right-sided hemiparesis, hypertension, asthma.   PAST SURGICAL HISTORY: Denies.   SOCIAL HISTORY: Smokes a pack a day for 35 years or so. No alcohol or drug use. Lives with his wife.   FAMILY HISTORY: Denies any history of hypertension, diabetes, stroke or heart attacks.   OUTPATIENT MEDICATIONS: He takes aspirin 325 mg daily, labetalol 100 mg once a day, Spiriva 18 mcg daily, albuterol nebs every 4 hours as needed for shortness of breath, amlodipine 10 mg daily, Flovent 110 mcg inhaled 2 puffs 2 times a day and Proventil p.r.n.   REVIEW OF SYSTEMS: CONSTITUTIONAL: No fevers, chills or weight issues.  EYES: No blurry vision, no double vision.  ENT: No tinnitus, hearing loss, snoring or congestion.  RESPIRATORY: Positive for cough as above. Positive for wheezing and shortness of breath. He has  history of asthma.  CARDIOVASCULAR: Denies chest pain, swelling in the legs, dyspnea on exertion or syncope.  GASTROINTESTINAL: No nausea, vomiting, diarrhea, abdominal pain, melena or dark stools.  GENITOURINARY: No dysuria or hematuria, but symptoms he has difficulty initiating stream.  ENDOCRINE: Denies polyuria or nocturia.  HEMATOLOGIC AND LYMPHATIC: Denies anemia or easy bruising.  SKIN: No rashes.  MUSCULOSKELETAL: Denies arthritis or gout.  NEUROLOGIC: Has history of stroke.  PSYCHIATRIC: No anxiety or depression.   PHYSICAL EXAMINATION: VITAL SIGNS: Temperature on arrival 98.6, pulse rate 88, respiratory rate 24, initial blood pressure was 203/109 and O2 sat 96% on room air. Last blood pressure 150/90, O2 sat 95% on oxygen. GENERAL: The patient is a well-developed male sitting in bed.  HEENT: Normocephalic, atraumatic. Pupils are equal and reactive. Anicteric sclerae. Extraocular muscles intact. Moist mucous membranes but poor dentition.  NECK: Supple. No thyroid tenderness. No lymphadenopathy.  HEART: S1 and S2 regular rate and rhythm. No murmurs, rubs or gallops.  LUNGS: There is bilateral moderate wheezing in all lung fields and somewhat decreased air entry.  ABDOMEN: Soft, nontender and nondistended. Positive bowel sounds in all quadrants. No organomegaly appreciated.  EXTREMITIES: No significant lower extremity edema.  NEUROLOGIC: The patient has right-sided facial droop and also the tongue protrudes towards the right and sensation is intact in all regions on the face. Strength appears to be 5/5 in all extremities. Sensation is intact to light touch.  PSYCHIATRIC: Awake, alert and oriented x 3. Cooperative.   LABORATORY AND DIAGNOSTICS: X-ray of the chest, PA and lateral: No  acute abnormalities.  Glucose 108, BUN 19, creatinine 1.3, sodium 140, potassium 4.1. Troponin negative. WBC 12.4, hemoglobin 17 and platelets 253.   EKG: Normal sinus rhythm, left atrial enlargement,  rate is 90. No acute ST elevations or depressions.   ASSESSMENT AND PLAN: We have a 64 year old male with history of stroke, asthma and hypertension who is here for shortness of breath. The patient does have systemic inflammatory response syndrome criteria of tachypnea and mild leukocytosis. Of note, he did receive some steroids through the EMS. I believe that his systemic inflammatory response syndrome criteria is likely secondary to his acute exacerbation of asthma as well as acute bronchitis. I would admit him to the hospital, start him on standing and p.r.n. nebs and IV steroids. He has received azithromycin 500 p.o. x 1 and I would start him on azithro starting tomorrow. I would continue the Spiriva, hold his Flovent at this point and provide him on supplemental oxygen. He still does smoke and he was counseled for 3 minutes about smoking. He states that it is hard. I will start him on a nicotine patch at this point. The patient has no fever or evidence for pneumonia on x-ray of the chest. He did appear to have accelerated hypertension on arrival. He does have history of stroke and his blood pressure should be carefully monitored. He is on labetalol once a day, low-dose, which is suboptimal. I would hold that and start him on lisinopril, which could be titrated upwards, I would give him generic, and I would also continue amlodipine. I would continue his aspirin. He is not on a statin. I would go ahead and start him on 1 given his history of stroke and check a lipid profile for tomorrow. I would start him on deep vein prophylaxis as well. The patient is FULL CODE.   TOTAL TIME SPENT: 50 minutes.  ____________________________ Krystal EatonShayiq Tifani Dack, MD sa:sb D: 12/23/2012 11:05:57 ET T: 12/23/2012 11:27:47 ET JOB#: [id number]  cc: Krystal EatonShayiq Virgene Tirone, MD, <Dictator> Meindert A. Lacie ScottsNiemeyer, MD Krystal EatonSHAYIQ Hayli Milligan MD ELECTRONICALLY SIGNED 12/31/2012 15:07

## 2014-12-07 ENCOUNTER — Encounter: Payer: Self-pay | Admitting: *Deleted

## 2014-12-07 ENCOUNTER — Emergency Department: Payer: Commercial Managed Care - HMO

## 2014-12-07 ENCOUNTER — Emergency Department
Admission: EM | Admit: 2014-12-07 | Discharge: 2014-12-07 | Disposition: A | Discharge status: Home / Self Care | Payer: Commercial Managed Care - HMO | Attending: Emergency Medicine | Admitting: Emergency Medicine

## 2014-12-07 DIAGNOSIS — Z720 Tobacco use: Secondary | ICD-10-CM | POA: Diagnosis not present

## 2014-12-07 DIAGNOSIS — J441 Chronic obstructive pulmonary disease with (acute) exacerbation: Secondary | ICD-10-CM | POA: Diagnosis not present

## 2014-12-07 DIAGNOSIS — K219 Gastro-esophageal reflux disease without esophagitis: Secondary | ICD-10-CM | POA: Diagnosis not present

## 2014-12-07 DIAGNOSIS — I10 Essential (primary) hypertension: Secondary | ICD-10-CM | POA: Insufficient documentation

## 2014-12-07 DIAGNOSIS — R0602 Shortness of breath: Secondary | ICD-10-CM | POA: Diagnosis present

## 2014-12-07 DIAGNOSIS — Z760 Encounter for issue of repeat prescription: Secondary | ICD-10-CM | POA: Insufficient documentation

## 2014-12-07 DIAGNOSIS — J439 Emphysema, unspecified: Secondary | ICD-10-CM

## 2014-12-07 HISTORY — DX: Essential (primary) hypertension: I10

## 2014-12-07 HISTORY — DX: Unspecified asthma, uncomplicated: J45.909

## 2014-12-07 HISTORY — DX: Cerebral infarction, unspecified: I63.9

## 2014-12-07 LAB — BASIC METABOLIC PANEL
ANION GAP: 7 (ref 5–15)
BUN: 18 mg/dL (ref 6–20)
CALCIUM: 8.8 mg/dL — AB (ref 8.9–10.3)
CO2: 27 mmol/L (ref 22–32)
Chloride: 105 mmol/L (ref 101–111)
Creatinine, Ser: 1.25 mg/dL — ABNORMAL HIGH (ref 0.61–1.24)
GFR calc non Af Amer: 58 mL/min — ABNORMAL LOW (ref >60)
GLUCOSE: 91 mg/dL (ref 65–99)
POTASSIUM: 4.1 mmol/L (ref 3.5–5.1)
Sodium: 139 mmol/L (ref 135–145)

## 2014-12-07 LAB — CBC
HEMATOCRIT: 43.9 % (ref 40.0–52.0)
HEMOGLOBIN: 14.7 g/dL (ref 13.0–18.0)
MCH: 29.8 pg (ref 26.0–34.0)
MCHC: 33.4 g/dL (ref 32.0–36.0)
MCV: 89.2 fL (ref 80.0–100.0)
Platelets: 211 10*3/uL (ref 150–440)
RBC: 4.92 MIL/uL (ref 4.40–5.90)
RDW: 14.8 % — ABNORMAL HIGH (ref 11.5–14.5)
WBC: 8 10*3/uL (ref 3.8–10.6)

## 2014-12-07 LAB — TROPONIN I

## 2014-12-07 MED ORDER — SUCRALFATE 1 G PO TABS: 1.0000 g | ORAL_TABLET | Freq: Four times a day (QID) | ORAL | Status: DC

## 2014-12-07 MED ORDER — PREDNISONE 20 MG PO TABS: 40.0000 mg | ORAL_TABLET | Freq: Every day | ORAL | Status: DC

## 2014-12-07 MED ORDER — IPRATROPIUM-ALBUTEROL 0.5-2.5 (3) MG/3ML IN SOLN
3.0000 mL | Freq: Once | RESPIRATORY_TRACT | Status: AC
  Administered 2014-12-07: 3 mL via RESPIRATORY_TRACT
  Filled 2014-12-07: qty 3

## 2014-12-07 MED ORDER — ALBUTEROL SULFATE (2.5 MG/3ML) 0.083% IN NEBU: 2.5000 mg | INHALATION_SOLUTION | Freq: Four times a day (QID) | RESPIRATORY_TRACT | Status: DC | PRN

## 2014-12-07 MED ORDER — ALBUTEROL SULFATE HFA 108 (90 BASE) MCG/ACT IN AERS: 2.0000 | INHALATION_SPRAY | Freq: Four times a day (QID) | RESPIRATORY_TRACT | Status: DC | PRN

## 2014-12-07 MED ORDER — PREDNISONE 20 MG PO TABS
60.0000 mg | ORAL_TABLET | Freq: Once | ORAL | Status: AC
  Administered 2014-12-07: 60 mg via ORAL
  Filled 2014-12-07: qty 3

## 2014-12-07 MED ORDER — RANITIDINE HCL 150 MG PO CAPS: 150.0000 mg | ORAL_CAPSULE | Freq: Two times a day (BID) | ORAL | Status: DC

## 2014-12-07 MED ORDER — ALBUTEROL SULFATE (2.5 MG/3ML) 0.083% IN NEBU
5.0000 mg | INHALATION_SOLUTION | Freq: Once | RESPIRATORY_TRACT | Status: AC
  Administered 2014-12-07: 5 mg via RESPIRATORY_TRACT

## 2014-12-07 MED ORDER — TIOTROPIUM BROMIDE MONOHYDRATE 18 MCG IN CAPS: 18.0000 ug | ORAL_CAPSULE | Freq: Every day | RESPIRATORY_TRACT | Status: DC

## 2014-12-07 NOTE — ED Provider Notes (Signed)
[facility] Emergency Department Provider Note  ____________________________________________  Time seen: 8:00 PM  I have reviewed the triage vital signs and the nursing notes.   HISTORY  Chief Complaint Shortness of Breath    HPI Roger Wilkerson is a 64 y.o. male who complains of chest tightness that saline for several hours today. He states that he's had a similar pain for several months and it is entirely consistent with acid reflux that he's had in the past. He ran out of his and asked about 4 or 5 days ago. In fact he's run out of all of his medications including his antihypertensive and his COPD inhalers. He also reports that he is having nonproductive cough shortness of breath and wheezing for the last 2 days because he ran out of his inhalers. No fever or chills.  Chest pain is not exertional, not pleuritic.   Past Medical History  Diagnosis Date  . Stroke   . Hypertension   . Asthma      There are no active problems to display for this patient.    No past surgical history on file. Negative  Current Outpatient Rx  Name  Route  Sig  Dispense  Refill  . albuterol (PROVENTIL HFA;VENTOLIN HFA) 108 (90 BASE) MCG/ACT inhaler   Inhalation   Inhale 2 puffs into the lungs every 6 (six) hours as needed for wheezing or shortness of breath.   1 Inhaler   0   . albuterol (PROVENTIL) (2.5 MG/3ML) 0.083% nebulizer solution   Nebulization   Take 3 mLs (2.5 mg total) by nebulization every 6 (six) hours as needed for wheezing or shortness of breath.   300 mL   0   . predniSONE (DELTASONE) 20 MG tablet   Oral   Take 2 tablets (40 mg total) by mouth daily.   8 tablet   0   . ranitidine (ZANTAC) 150 MG capsule   Oral   Take 1 capsule (150 mg total) by mouth 2 (two) times daily.   28 capsule   0   . sucralfate (CARAFATE) 1 G tablet   Oral   Take 1 tablet (1 g total) by mouth 4 (four) times daily.   120 tablet   1   . tiotropium (SPIRIVA)  18 MCG inhalation capsule   Inhalation   Place 1 capsule (18 mcg total) into inhaler and inhale daily.   30 capsule   0      Allergies Review of patient's allergies indicates no known allergies.   No family history on file.  Social History Social History  Substance Use Topics  . Smoking status: Current Every Day Smoker  . Smokeless tobacco: None  . Alcohol Use: No    Review of Systems  Constitutional:   No fever or chills. No weight changes Eyes:   No blurry vision or double vision.  ENT:   No sore throat. Cardiovascular:   Positive chest pain as above. Respiratory:  Shortness of breath with nonproductive cough.. Gastrointestinal:   Negative for abdominal pain, vomiting and diarrhea.  No BRBPR or melena. Genitourinary:   Negative for dysuria, urinary retention, bloody urine, or difficulty urinating. Musculoskeletal:   Negative for back pain. No joint swelling or pain. Skin:   Negative for rash. Neurological:   Negative for headaches, focal weakness or numbness. Psychiatric:  No anxiety or depression.   Endocrine:  No hot/cold intolerance, changes in energy, or sleep difficulty.  10-point ROS otherwise negative.  ____________________________________________  PHYSICAL EXAM:  VITAL SIGNS: ED Triage Vitals  Enc Vitals Group     BP 12/07/14 1936 205/108 mmHg     Pulse Rate 12/07/14 1936 89     Resp 12/07/14 1936 26     Temp 12/07/14 1936 98.3 F (36.8 C)     Temp Source 12/07/14 1936 Oral     SpO2 12/07/14 1936 96 %     Weight 12/07/14 1936 210 lb (95.255 kg)     Height 12/07/14 1936  (1.803 m)     Head Cir --      Peak Flow --      Pain Score 12/07/14 1937 2     Pain Loc --      Pain Edu? --      Excl. in GC? --      Constitutional:   Alert and oriented. Well appearing and in no distress. Eyes:   No scleral icterus. No conjunctival pallor. PERRL. EOMI ENT   Head:   Normocephalic and atraumatic.   Nose:   No congestion/rhinnorhea. No  septal hematoma   Mouth/Throat:   MMM, no pharyngeal erythema. No peritonsillar mass. No uvula shift.   Neck:   No stridor. No SubQ emphysema. No meningismus. Hematological/Lymphatic/Immunilogical:   No cervical lymphadenopathy. Cardiovascular:   RRR. Normal and symmetric distal pulses are present in all extremities. No murmurs, rubs, or gallops. Respiratory:   No chest tube. Prolonged expirations, diffuse expiratory wheezing.. Gastrointestinal:   Left upper quadrant and epigastric tenderness which reproduces the chest pain. No distention. There is no CVA tenderness.  No rebound, rigidity, or guarding. Genitourinary:   deferred Musculoskeletal:   Nontender with normal range of motion in all extremities. No joint effusions.  No lower extremity tenderness.  No edema. Neurologic:   Normal speech and language.  CN 2-10 normal. Motor grossly intact. No pronator drift.  Normal gait. No gross focal neurologic deficits are appreciated.  Skin:    Skin is warm, dry and intact. No rash noted.  No petechiae, purpura, or bullae. Psychiatric:   Mood and affect are normal. Speech and behavior are normal. Patient exhibits appropriate insight and judgment.  ____________________________________________    LABS (pertinent positives/negatives) (all labs ordered are listed, but only abnormal results are displayed) Labs Reviewed  BASIC METABOLIC PANEL - Abnormal; Notable for the following:    Creatinine, Ser 1.25 (*)    Calcium 8.8 (*)    GFR calc non Af Amer 58 (*)    All other components within normal limits  CBC - Abnormal; Notable for the following:    RDW 14.8 (*)    All other components within normal limits  TROPONIN I   ____________________________________________   EKG  Interpreted by me Normal sinus rhythm rate of 77, normal axis and intervals, poor R-wave progression in anterior precordial leads. Normal ST segments and T waves  ____________________________________________     RADIOLOGY  Chest x-ray unremarkable  ____________________________________________   PROCEDURES   ____________________________________________   INITIAL IMPRESSION / ASSESSMENT AND PLAN / ED COURSE  Pertinent labs & imaging results that were available during my care of the patient were reviewed by me and considered in my medical decision making (see chart for details).  Patient presents with chest pain which is very low suspicion for ACS PE TAD carditis. It is tenderness pneumonia or sepsis. Is highly consistent with his history of GERD. We'll give him antacids for this. Also was found to have significant wheezing which appears to be an exacerbation  of his COPD due to ongoing smoking and running out of his inhalers.  We'll refill the patient's medications. He states it is employed with his primary care doctor next week.   ----------------------------------------- 8:57 PM on 12/07/2014 -----------------------------------------  Workup unremarkable. Unable to verify the patient's antihypertensives. I refilled his inhalers and wrote prescriptions for antacids. We'll have him follow-up with his primary care doctor next week for further management. He is well-appearing and nontoxic. We'll start a course of steroids as well.  ____________________________________________   FINAL CLINICAL IMPRESSION(S) / ED DIAGNOSES  Final diagnoses:  Gastroesophageal reflux disease, esophagitis presence not specified  Pulmonary emphysema, unspecified emphysema type  Medication refill   medication refill Mild COPD exacerbation GERD    Sharman Cheek, MD 12/07/14 [id number]

## 2014-12-07 NOTE — ED Notes (Signed)
Patient transported to X-ray 

## 2014-12-07 NOTE — ED Notes (Signed)
AAOx3/   Skin warm and dry.  No SOB/ DOE.  Ambulates with easy and steady gait.  NAD 

## 2014-12-07 NOTE — ED Notes (Signed)
Pt reports several days of shortness of breath and wheezing. About 1 hour prior to arrival onset of right/central chest pain

## 2014-12-07 NOTE — Discharge Instructions (Signed)
Chronic Obstructive Pulmonary Disease °Chronic obstructive pulmonary disease (COPD) is a common lung condition in which airflow from the lungs is limited. COPD is a general term that can be used to describe many different lung problems that limit airflow, including both chronic bronchitis and emphysema.  If you have COPD, your lung function will probably never return to normal, but there are measures you can take to improve lung function and make yourself feel better.  °CAUSES  °· Smoking (common).   °· Exposure to secondhand smoke.   °· Genetic problems. °· Chronic inflammatory lung diseases or recurrent infections. °SYMPTOMS  °· Shortness of breath, especially with physical activity.   °· Deep, persistent (chronic) cough with a large amount of thick mucus.   °· Wheezing.   °· Rapid breaths (tachypnea).   °· Gray or bluish discoloration (cyanosis) of the skin, especially in fingers, toes, or lips.   °· Fatigue.   °· Weight loss.   °· Frequent infections or episodes when breathing symptoms become much worse (exacerbations).   °· Chest tightness. °DIAGNOSIS  °Your health care provider will take a medical history and perform a physical examination to make the initial diagnosis.  Additional tests for COPD may include:  °· Lung (pulmonary) function tests. °· Chest X-ray. °· CT scan. °· Blood tests. °TREATMENT  °Treatment available to help you feel better when you have COPD includes:  °· Inhaler and nebulizer medicines. These help manage the symptoms of COPD and make your breathing more comfortable. °· Supplemental oxygen. Supplemental oxygen is only helpful if you have a low oxygen level in your blood.   °· Exercise and physical activity. These are beneficial for nearly all people with COPD. Some people may also benefit from a pulmonary rehabilitation program. °HOME CARE INSTRUCTIONS  °· Take all medicines (inhaled or pills) as directed by your health care provider. °· Avoid over-the-counter medicines or cough syrups  that dry up your airway (such as antihistamines) and slow down the elimination of secretions unless instructed otherwise by your health care provider.   °· If you are a smoker, the most important thing that you can do is stop smoking. Continuing to smoke will cause further lung damage and breathing trouble. Ask your health care provider for help with quitting smoking. He or she can direct you to community resources or hospitals that provide support. °· Avoid exposure to irritants such as smoke, chemicals, and fumes that aggravate your breathing. °· Use oxygen therapy and pulmonary rehabilitation if directed by your health care provider. If you require home oxygen therapy, ask your health care provider whether you should purchase a pulse oximeter to measure your oxygen level at home.   °· Avoid contact with individuals who have a contagious illness. °· Avoid extreme temperature and humidity changes. °· Eat healthy foods. Eating smaller, more frequent meals and resting before meals may help you maintain your strength. °· Stay active, but balance activity with periods of rest. Exercise and physical activity will help you maintain your ability to do things you want to do. °· Preventing infection and hospitalization is very important when you have COPD. Make sure to receive all the vaccines your health care provider recommends, especially the pneumococcal and influenza vaccines. Ask your health care provider whether you need a pneumonia vaccine. °· Learn and use relaxation techniques to manage stress. °· Learn and use controlled breathing techniques as directed by your health care provider. Controlled breathing techniques include:   °· Pursed lip breathing. Start by breathing in (inhaling) through your nose for 1 second. Then, purse your lips as if you were   going to whistle and breathe out (exhale) through the pursed lips for 2 seconds.   °¨ Diaphragmatic breathing. Start by putting one hand on your abdomen just above  your waist. Inhale slowly through your nose. The hand on your abdomen should move out. Then purse your lips and exhale slowly. You should be able to feel the hand on your abdomen moving in as you exhale.   °· Learn and use controlled coughing to clear mucus from your lungs. Controlled coughing is a series of short, progressive coughs. The steps of controlled coughing are:   °1. Lean your head slightly forward.   °2. Breathe in deeply using diaphragmatic breathing.   °3. Try to hold your breath for 3 seconds.   °4. Keep your mouth slightly open while coughing twice.   °5. Spit any mucus out into a tissue.   °6. Rest and repeat the steps once or twice as needed. °SEEK MEDICAL CARE IF:  °· You are coughing up more mucus than usual.   °· There is a change in the color or thickness of your mucus.   °· Your breathing is more labored than usual.   °· Your breathing is faster than usual.   °SEEK IMMEDIATE MEDICAL CARE IF:  °· You have shortness of breath while you are resting.   °· You have shortness of breath that prevents you from: °¨ Being able to talk.   °¨ Performing your usual physical activities.   °· You have chest pain lasting longer than 5 minutes.   °· Your skin color is more cyanotic than usual. °· You measure low oxygen saturations for longer than 5 minutes with a pulse oximeter. °MAKE SURE YOU:  °· Understand these instructions. °· Will watch your condition. °· Will get help right away if you are not doing well or get worse. °Document Released: 12/27/2004 Document Revised: 08/03/2013 Document Reviewed: 11/13/2012 °[employer]® Patient Information ©2015 [employer]. This information is not intended to replace advice given to you by your health care provider. Make sure you discuss any questions you have with your health care provider. ° °Gastroesophageal Reflux Disease, Adult °Gastroesophageal reflux disease (GERD) happens when acid from your stomach flows up into the esophagus. When acid comes in contact with  the esophagus, the acid causes soreness (inflammation) in the esophagus. Over time, GERD may create small holes (ulcers) in the lining of the esophagus. °CAUSES  °· Increased body weight. This puts pressure on the stomach, making acid rise from the stomach into the esophagus. °· Smoking. This increases acid production in the stomach. °· Drinking alcohol. This causes decreased pressure in the lower esophageal sphincter (valve or ring of muscle between the esophagus and stomach), allowing acid from the stomach into the esophagus. °· Late evening meals and a full stomach. This increases pressure and acid production in the stomach. °· A malformed lower esophageal sphincter. °Sometimes, no cause is found. °SYMPTOMS  °· Burning pain in the lower part of the mid-chest behind the breastbone and in the mid-stomach area. This may occur twice a week or more often. °· Trouble swallowing. °· Sore throat. °· Dry cough. °· Asthma-like symptoms including chest tightness, shortness of breath, or wheezing. °DIAGNOSIS  °Your caregiver may be able to diagnose GERD based on your symptoms. In some cases, X-rays and other tests may be done to check for complications or to check the condition of your stomach and esophagus. °TREATMENT  °Your caregiver may recommend over-the-counter or prescription medicines to help decrease acid production. Ask your caregiver before starting or adding any new medicines.  °HOME CARE INSTRUCTIONS  °·   Change the factors that you can control. Ask your caregiver for guidance concerning weight loss, quitting smoking, and alcohol consumption.  Avoid foods and drinks that make your symptoms worse, such as:  Caffeine or alcoholic drinks.  Chocolate.  Peppermint or mint flavorings.  Garlic and onions.  Spicy foods.  Citrus fruits, such as oranges, lemons, or limes.  Tomato-based foods such as sauce, chili, salsa, and pizza.  Fried and fatty foods.  Avoid lying down for the 3 hours prior to your  bedtime or prior to taking a nap.  Eat small, frequent meals instead of large meals.  Wear loose-fitting clothing. Do not wear anything tight around your waist that causes pressure on your stomach.  Raise the head of your bed 6 to 8 inches with wood blocks to help you sleep. Extra pillows will not help.  Only take over-the-counter or prescription medicines for pain, discomfort, or fever as directed by your caregiver.  Do not take aspirin, ibuprofen, or other nonsteroidal anti-inflammatory drugs (NSAIDs). SEEK IMMEDIATE MEDICAL CARE IF:   You have pain in your arms, neck, jaw, teeth, or back.  Your pain increases or changes in intensity or duration.  You develop nausea, vomiting, or sweating (diaphoresis).  You develop shortness of breath, or you faint.  Your vomit is green, yellow, black, or looks like coffee grounds or blood.  Your stool is red, bloody, or black. These symptoms could be signs of other problems, such as heart disease, gastric bleeding, or esophageal bleeding. MAKE SURE YOU:   Understand these instructions.  Will watch your condition.  Will get help right away if you are not doing well or get worse. Document Released: 12/27/2004 Document Revised: 06/11/2011 Document Reviewed: 10/06/2010 [facility] Patient Information 2015 [place], [place]. This information is not intended to replace advice given to you by your health care provider. Make sure you discuss any questions you have with your health care provider.

## 2015-02-26 ENCOUNTER — Emergency Department: Payer: Commercial Managed Care - HMO

## 2015-02-26 ENCOUNTER — Encounter: Payer: Self-pay | Admitting: Emergency Medicine

## 2015-02-26 ENCOUNTER — Emergency Department
Admission: EM | Admit: 2015-02-26 | Discharge: 2015-02-27 | Disposition: A | Discharge status: Home / Self Care | Payer: Commercial Managed Care - HMO | Attending: Emergency Medicine | Admitting: Emergency Medicine

## 2015-02-26 DIAGNOSIS — R079 Chest pain, unspecified: Secondary | ICD-10-CM | POA: Diagnosis present

## 2015-02-26 DIAGNOSIS — Z79899 Other long term (current) drug therapy: Secondary | ICD-10-CM | POA: Diagnosis not present

## 2015-02-26 DIAGNOSIS — J441 Chronic obstructive pulmonary disease with (acute) exacerbation: Secondary | ICD-10-CM | POA: Insufficient documentation

## 2015-02-26 DIAGNOSIS — I10 Essential (primary) hypertension: Secondary | ICD-10-CM | POA: Insufficient documentation

## 2015-02-26 DIAGNOSIS — F17210 Nicotine dependence, cigarettes, uncomplicated: Secondary | ICD-10-CM | POA: Diagnosis not present

## 2015-02-26 LAB — BASIC METABOLIC PANEL
Anion gap: 5 (ref 5–15)
BUN: 17 mg/dL (ref 6–20)
CALCIUM: 8.5 mg/dL — AB (ref 8.9–10.3)
CO2: 25 mmol/L (ref 22–32)
CREATININE: 1.13 mg/dL (ref 0.61–1.24)
Chloride: 111 mmol/L (ref 101–111)
GFR calc non Af Amer: >60 mL/min (ref >60)
Glucose, Bld: 93 mg/dL (ref 65–99)
Potassium: 3.7 mmol/L (ref 3.5–5.1)
SODIUM: 141 mmol/L (ref 135–145)

## 2015-02-26 LAB — HEPATIC FUNCTION PANEL
ALBUMIN: 3.7 g/dL (ref 3.5–5.0)
ALK PHOS: 52 U/L (ref 38–126)
ALT: 17 U/L (ref 17–63)
AST: 23 U/L (ref 15–41)
Bilirubin, Direct: <0.1 mg/dL — ABNORMAL LOW (ref 0.1–0.5)
TOTAL PROTEIN: 7.2 g/dL (ref 6.5–8.1)
Total Bilirubin: 0.4 mg/dL (ref 0.3–1.2)

## 2015-02-26 LAB — LIPASE, BLOOD: Lipase: 36 U/L (ref 11–51)

## 2015-02-26 LAB — CBC
HCT: 39.4 % — ABNORMAL LOW (ref 40.0–52.0)
Hemoglobin: 13.1 g/dL (ref 13.0–18.0)
MCH: 28.9 pg (ref 26.0–34.0)
MCHC: 33.1 g/dL (ref 32.0–36.0)
MCV: 87.3 fL (ref 80.0–100.0)
PLATELETS: 207 10*3/uL (ref 150–440)
RBC: 4.52 MIL/uL (ref 4.40–5.90)
RDW: 14 % (ref 11.5–14.5)
WBC: 8.5 10*3/uL (ref 3.8–10.6)

## 2015-02-26 LAB — TROPONIN I: Troponin I: <0.03 ng/mL (ref <0.031)

## 2015-02-26 MED ORDER — IPRATROPIUM-ALBUTEROL 0.5-2.5 (3) MG/3ML IN SOLN
RESPIRATORY_TRACT | Status: AC
  Administered 2015-02-26: 3 mL via RESPIRATORY_TRACT
  Filled 2015-02-26: qty 3

## 2015-02-26 MED ORDER — ALBUTEROL SULFATE HFA 108 (90 BASE) MCG/ACT IN AERS: INHALATION_SPRAY | RESPIRATORY_TRACT | Status: DC

## 2015-02-26 MED ORDER — PREDNISONE 20 MG PO TABS: 60.0000 mg | ORAL_TABLET | Freq: Every day | ORAL | Status: DC

## 2015-02-26 MED ORDER — METHYLPREDNISOLONE SODIUM SUCC 125 MG IJ SOLR
125.0000 mg | Freq: Once | INTRAMUSCULAR | Status: AC
  Administered 2015-02-26: 125 mg via INTRAVENOUS
  Filled 2015-02-26: qty 2

## 2015-02-26 MED ORDER — ALBUTEROL SULFATE (2.5 MG/3ML) 0.083% IN NEBU
2.5000 mg | INHALATION_SOLUTION | Freq: Four times a day (QID) | RESPIRATORY_TRACT | Status: DC | PRN
Start: 2015-02-26 — End: 2015-05-28

## 2015-02-26 MED ORDER — LISINOPRIL 10 MG PO TABS: 10.0000 mg | ORAL_TABLET | Freq: Every day | ORAL | Status: DC

## 2015-02-26 MED ORDER — IPRATROPIUM-ALBUTEROL 0.5-2.5 (3) MG/3ML IN SOLN
3.0000 mL | Freq: Once | RESPIRATORY_TRACT | Status: AC
  Administered 2015-02-26: 3 mL via RESPIRATORY_TRACT
  Filled 2015-02-26: qty 3

## 2015-02-26 MED ORDER — LISINOPRIL 10 MG PO TABS
10.0000 mg | ORAL_TABLET | Freq: Once | ORAL | Status: AC
  Administered 2015-02-26: 10 mg via ORAL
  Filled 2015-02-26: qty 1

## 2015-02-26 MED ORDER — ASPIRIN 81 MG PO CHEW
324.0000 mg | CHEWABLE_TABLET | Freq: Once | ORAL | Status: AC
  Administered 2015-02-26: 324 mg via ORAL
  Filled 2015-02-26: qty 4

## 2015-02-26 NOTE — ED Notes (Signed)
Pharmacy sending lisinopril 10 mg.

## 2015-02-26 NOTE — Discharge Instructions (Signed)
We believe that your symptoms are caused today by an exacerbation of your COPD, and possibly bronchitis.  Please take the prescribed medications and any medications that you have at home for your COPD.  Follow up with your doctor as recommended.  If you develop any new or worsening symptoms, including but not limited to fever, persistent vomiting, worsening shortness of breath, or other symptoms that concern you, please return to the Emergency Department immediately.  We also recommend that you start taking your lisinopril again for your blood pressure, and see your primary care doctor as soon as possible to discuss additional blood pressure management.    Chronic Obstructive Pulmonary Disease Chronic obstructive pulmonary disease (COPD) is a common lung condition in which airflow from the lungs is limited. COPD is a general term that can be used to describe many different lung problems that limit airflow, including both chronic bronchitis and emphysema. If you have COPD, your lung function will probably never return to normal, but there are measures you can take to improve lung function and make yourself feel better.  CAUSES   Smoking (common).   Exposure to secondhand smoke.   Genetic problems.  Chronic inflammatory lung diseases or recurrent infections. SYMPTOMS   Shortness of breath, especially with physical activity.   Deep, persistent (chronic) cough with a large amount of thick mucus.   Wheezing.   Rapid breaths (tachypnea).   Gray or bluish discoloration (cyanosis) of the skin, especially in fingers, toes, or lips.   Fatigue.   Weight loss.   Frequent infections or episodes when breathing symptoms become much worse (exacerbations).   Chest tightness. DIAGNOSIS  Your health care provider will take a medical history and perform a physical examination to make the initial diagnosis. Additional tests for COPD may include:   Lung (pulmonary) function  tests.  Chest X-ray.  CT scan.  Blood tests. TREATMENT  Treatment available to help you feel better when you have COPD includes:   Inhaler and nebulizer medicines. These help manage the symptoms of COPD and make your breathing more comfortable.  Supplemental oxygen. Supplemental oxygen is only helpful if you have a low oxygen level in your blood.   Exercise and physical activity. These are beneficial for nearly all people with COPD. Some people may also benefit from a pulmonary rehabilitation program. HOME CARE INSTRUCTIONS   Take all medicines (inhaled or pills) as directed by your health care provider.  Avoid over-the-counter medicines or cough syrups that dry up your airway (such as antihistamines) and slow down the elimination of secretions unless instructed otherwise by your health care provider.   If you are a smoker, the most important thing that you can do is stop smoking. Continuing to smoke will cause further lung damage and breathing trouble. Ask your health care provider for help with quitting smoking. He or she can direct you to community resources or hospitals that provide support.  Avoid exposure to irritants such as smoke, chemicals, and fumes that aggravate your breathing.  Use oxygen therapy and pulmonary rehabilitation if directed by your health care provider. If you require home oxygen therapy, ask your health care provider whether you should purchase a pulse oximeter to measure your oxygen level at home.   Avoid contact with individuals who have a contagious illness.  Avoid extreme temperature and humidity changes.  Eat healthy foods. Eating smaller, more frequent meals and resting before meals may help you maintain your strength.  Stay active, but balance activity with periods of  rest. Exercise and physical activity will help you maintain your ability to do things you want to do.  Preventing infection and hospitalization is very important when you have  COPD. Make sure to receive all the vaccines your health care provider recommends, especially the pneumococcal and influenza vaccines. Ask your health care provider whether you need a pneumonia vaccine.  Learn and use relaxation techniques to manage stress.  Learn and use controlled breathing techniques as directed by your health care provider. Controlled breathing techniques include:   Pursed lip breathing. Start by breathing in (inhaling) through your nose for 1 second. Then, purse your lips as if you were going to whistle and breathe out (exhale) through the pursed lips for 2 seconds.   Diaphragmatic breathing. Start by putting one hand on your abdomen just above your waist. Inhale slowly through your nose. The hand on your abdomen should move out. Then purse your lips and exhale slowly. You should be able to feel the hand on your abdomen moving in as you exhale.   Learn and use controlled coughing to clear mucus from your lungs. Controlled coughing is a series of short, progressive coughs. The steps of controlled coughing are:  1. Lean your head slightly forward.  2. Breathe in deeply using diaphragmatic breathing.  3. Try to hold your breath for 3 seconds.  4. Keep your mouth slightly open while coughing twice.  5. Spit any mucus out into a tissue.  6. Rest and repeat the steps once or twice as needed. SEEK MEDICAL CARE IF:   You are coughing up more mucus than usual.   There is a change in the color or thickness of your mucus.   Your breathing is more labored than usual.   Your breathing is faster than usual.  SEEK IMMEDIATE MEDICAL CARE IF:   You have shortness of breath while you are resting.   You have shortness of breath that prevents you from:  Being able to talk.   Performing your usual physical activities.   You have chest pain lasting longer than 5 minutes.   Your skin color is more cyanotic than usual.  You measure low oxygen saturations for  longer than 5 minutes with a pulse oximeter. MAKE SURE YOU:   Understand these instructions.  Will watch your condition.  Will get help right away if you are not doing well or get worse. Document Released: 12/27/2004 Document Revised: 08/03/2013 Document Reviewed: 11/13/2012 [facility] Patient Information 2015 [place], [place]. This information is not intended to replace advice given to you by your health care provider. Make sure you discuss any questions you have with your health care provider.

## 2015-02-26 NOTE — ED Notes (Signed)
Pt came in with CP and SOB, initial BP 230/113.  Pt states he is on antihypertensive medication but has been unable to fill the prescriptions and has not taken any medication in over a week.
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
## 2015-02-27 LAB — TROPONIN I

## 2015-02-27 NOTE — ED Provider Notes (Signed)
-----------------------------------------   2:30 AM on 02/27/2015 -----------------------------------------  I accepted signout of this patient from Dr. York CeriseForbach earlier this evening. Please see Dr. Scharlene CornForbach's notes for details on this patient's history and treatment plan.   A troponin was pending. That has now returned and is normal. I reassessed the patient. He is comfortable without any discomfort. His blood pressure is now 148/82. He feels comfortable and ready to go home.   Darien Ramusavid W Temekia Caskey, MD 02/27/15 [phone]

## 2015-02-27 NOTE — ED Notes (Signed)
Gave pt. Phone to call for pick-up, pt. Got a hold of ride.  Will wait in waiting room.

## 2015-05-28 ENCOUNTER — Encounter: Payer: Self-pay | Admitting: Emergency Medicine

## 2015-05-28 ENCOUNTER — Emergency Department
Admission: EM | Admit: 2015-05-28 | Discharge: 2015-05-28 | Disposition: A | Discharge status: Home / Self Care | Payer: Commercial Managed Care - HMO | Attending: Emergency Medicine | Admitting: Emergency Medicine

## 2015-05-28 ENCOUNTER — Emergency Department: Payer: Commercial Managed Care - HMO

## 2015-05-28 DIAGNOSIS — I10 Essential (primary) hypertension: Secondary | ICD-10-CM | POA: Insufficient documentation

## 2015-05-28 DIAGNOSIS — Z79899 Other long term (current) drug therapy: Secondary | ICD-10-CM | POA: Insufficient documentation

## 2015-05-28 DIAGNOSIS — F17210 Nicotine dependence, cigarettes, uncomplicated: Secondary | ICD-10-CM | POA: Diagnosis not present

## 2015-05-28 DIAGNOSIS — R0602 Shortness of breath: Secondary | ICD-10-CM | POA: Diagnosis present

## 2015-05-28 DIAGNOSIS — J441 Chronic obstructive pulmonary disease with (acute) exacerbation: Secondary | ICD-10-CM | POA: Insufficient documentation

## 2015-05-28 LAB — TROPONIN I
Troponin I: 0.04 ng/mL — ABNORMAL HIGH (ref <0.031)
Troponin I: 0.03 ng/mL (ref <0.031)

## 2015-05-28 LAB — CBC
HCT: 38.9 % — ABNORMAL LOW (ref 40.0–52.0)
Hemoglobin: 13 g/dL (ref 13.0–18.0)
MCH: 29.1 pg (ref 26.0–34.0)
MCHC: 33.5 g/dL (ref 32.0–36.0)
MCV: 86.9 fL (ref 80.0–100.0)
Platelets: 212 10*3/uL (ref 150–440)
RBC: 4.48 MIL/uL (ref 4.40–5.90)
RDW: 15.9 % — AB (ref 11.5–14.5)
WBC: 6.6 10*3/uL (ref 3.8–10.6)

## 2015-05-28 LAB — BASIC METABOLIC PANEL
ANION GAP: 6 (ref 5–15)
BUN: 14 mg/dL (ref 6–20)
CALCIUM: 9 mg/dL (ref 8.9–10.3)
CO2: 28 mmol/L (ref 22–32)
Chloride: 111 mmol/L (ref 101–111)
Creatinine, Ser: 1.31 mg/dL — ABNORMAL HIGH (ref 0.61–1.24)
GFR calc Af Amer: >60 mL/min (ref >60)
GFR calc non Af Amer: 55 mL/min — ABNORMAL LOW (ref >60)
GLUCOSE: 96 mg/dL (ref 65–99)
POTASSIUM: 4.2 mmol/L (ref 3.5–5.1)
Sodium: 145 mmol/L (ref 135–145)

## 2015-05-28 MED ORDER — IPRATROPIUM-ALBUTEROL 0.5-2.5 (3) MG/3ML IN SOLN
3.0000 mL | Freq: Once | RESPIRATORY_TRACT | Status: AC
  Administered 2015-05-28: 3 mL via RESPIRATORY_TRACT

## 2015-05-28 MED ORDER — PREDNISONE 20 MG PO TABS
40.0000 mg | ORAL_TABLET | Freq: Once | ORAL | Status: AC
  Administered 2015-05-28: 40 mg via ORAL
  Filled 2015-05-28: qty 2

## 2015-05-28 MED ORDER — ALBUTEROL SULFATE HFA 108 (90 BASE) MCG/ACT IN AERS
2.0000 | INHALATION_SPRAY | Freq: Four times a day (QID) | RESPIRATORY_TRACT | Status: DC | PRN
Start: 2015-05-28 — End: 2015-09-18

## 2015-05-28 MED ORDER — ALBUTEROL SULFATE (2.5 MG/3ML) 0.083% IN NEBU
5.0000 mg | INHALATION_SOLUTION | RESPIRATORY_TRACT | Status: AC
  Administered 2015-05-28: 5 mg via RESPIRATORY_TRACT
  Filled 2015-05-28: qty 6

## 2015-05-28 MED ORDER — IPRATROPIUM-ALBUTEROL 0.5-2.5 (3) MG/3ML IN SOLN
RESPIRATORY_TRACT | Status: AC
  Administered 2015-05-28: 6 mL via RESPIRATORY_TRACT
  Filled 2015-05-28: qty 6

## 2015-05-28 MED ORDER — IPRATROPIUM-ALBUTEROL 0.5-2.5 (3) MG/3ML IN SOLN
6.0000 mL | Freq: Once | RESPIRATORY_TRACT | Status: AC
  Administered 2015-05-28: 6 mL via RESPIRATORY_TRACT

## 2015-05-28 MED ORDER — LISINOPRIL 10 MG PO TABS: 10.0000 mg | ORAL_TABLET | Freq: Every day | ORAL | Status: DC

## 2015-05-28 MED ORDER — ASPIRIN 81 MG PO CHEW
324.0000 mg | CHEWABLE_TABLET | Freq: Once | ORAL | Status: AC
  Administered 2015-05-28: 324 mg via ORAL
  Filled 2015-05-28: qty 4

## 2015-05-28 MED ORDER — METHYLPREDNISOLONE SODIUM SUCC 125 MG IJ SOLR
INTRAMUSCULAR | Status: AC
  Administered 2015-05-28: 125 mg via INTRAVENOUS
  Filled 2015-05-28: qty 2

## 2015-05-28 MED ORDER — LISINOPRIL 10 MG PO TABS
10.0000 mg | ORAL_TABLET | Freq: Once | ORAL | Status: AC
  Administered 2015-05-28: 10 mg via ORAL
  Filled 2015-05-28: qty 1

## 2015-05-28 MED ORDER — IPRATROPIUM-ALBUTEROL 0.5-2.5 (3) MG/3ML IN SOLN
RESPIRATORY_TRACT | Status: AC
  Administered 2015-05-28: 3 mL via RESPIRATORY_TRACT
  Filled 2015-05-28: qty 3

## 2015-05-28 MED ORDER — METHYLPREDNISOLONE SODIUM SUCC 125 MG IJ SOLR
125.0000 mg | Freq: Once | INTRAMUSCULAR | Status: AC
  Administered 2015-05-28: 125 mg via INTRAVENOUS

## 2015-05-28 MED ORDER — PREDNISONE 20 MG PO TABS
40.0000 mg | ORAL_TABLET | Freq: Every day | ORAL | Status: DC
Start: 2015-05-28 — End: 2015-09-18

## 2015-05-28 NOTE — ED Notes (Signed)
EDP notifed of Pt's BP

## 2015-05-28 NOTE — ED Provider Notes (Signed)
-----------------------------------------   10:16 PM on 05/28/2015 -----------------------------------------  Patient's repeat troponin has resulted negative. I discussed results with the patient, we'll discharge home with primary care follow-up. Patient is agreeable to this plan. I discussed my normal chest pain/dyspnea return precautions.  Minna Antis, MD 05/28/15 2217

## 2015-05-28 NOTE — ED Notes (Signed)
Pt smoking in room, this RN informed pt of the risks of smoking in his room, EDP and charge nurse made aware

## 2015-05-28 NOTE — ED Notes (Signed)
SOB since yesterday, denies chest pain, history of asthma, resp tight but speaking full sentences.
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
## 2015-05-28 NOTE — Discharge Instructions (Signed)

## 2015-09-18 ENCOUNTER — Emergency Department: Payer: Commercial Managed Care - HMO

## 2015-09-18 ENCOUNTER — Emergency Department
Admission: EM | Admit: 2015-09-18 | Discharge: 2015-09-18 | Disposition: A | Discharge status: Home / Self Care | Payer: Commercial Managed Care - HMO | Attending: Emergency Medicine | Admitting: Emergency Medicine

## 2015-09-18 ENCOUNTER — Other Ambulatory Visit: Payer: Self-pay

## 2015-09-18 DIAGNOSIS — Z79899 Other long term (current) drug therapy: Secondary | ICD-10-CM | POA: Insufficient documentation

## 2015-09-18 DIAGNOSIS — I10 Essential (primary) hypertension: Secondary | ICD-10-CM | POA: Diagnosis not present

## 2015-09-18 DIAGNOSIS — F17210 Nicotine dependence, cigarettes, uncomplicated: Secondary | ICD-10-CM | POA: Insufficient documentation

## 2015-09-18 DIAGNOSIS — J45909 Unspecified asthma, uncomplicated: Secondary | ICD-10-CM | POA: Insufficient documentation

## 2015-09-18 DIAGNOSIS — R0602 Shortness of breath: Secondary | ICD-10-CM | POA: Diagnosis present

## 2015-09-18 DIAGNOSIS — Z8673 Personal history of transient ischemic attack (TIA), and cerebral infarction without residual deficits: Secondary | ICD-10-CM | POA: Diagnosis not present

## 2015-09-18 DIAGNOSIS — J441 Chronic obstructive pulmonary disease with (acute) exacerbation: Secondary | ICD-10-CM | POA: Insufficient documentation

## 2015-09-18 HISTORY — DX: Chronic obstructive pulmonary disease, unspecified: J44.9

## 2015-09-18 LAB — COMPREHENSIVE METABOLIC PANEL
ALBUMIN: 4.1 g/dL (ref 3.5–5.0)
ALT: 16 U/L — AB (ref 17–63)
AST: 24 U/L (ref 15–41)
Alkaline Phosphatase: 50 U/L (ref 38–126)
Anion gap: 7 (ref 5–15)
BUN: 14 mg/dL (ref 6–20)
CHLORIDE: 108 mmol/L (ref 101–111)
CO2: 27 mmol/L (ref 22–32)
CREATININE: 1.34 mg/dL — AB (ref 0.61–1.24)
Calcium: 9 mg/dL (ref 8.9–10.3)
GFR calc Af Amer: >60 mL/min (ref >60)
GFR, EST NON AFRICAN AMERICAN: 53 mL/min — AB (ref >60)
GLUCOSE: 100 mg/dL — AB (ref 65–99)
POTASSIUM: 3.4 mmol/L — AB (ref 3.5–5.1)
Sodium: 142 mmol/L (ref 135–145)
Total Bilirubin: 0.5 mg/dL (ref 0.3–1.2)
Total Protein: 7.7 g/dL (ref 6.5–8.1)

## 2015-09-18 LAB — TROPONIN I: Troponin I: <0.03 ng/mL (ref <0.031)

## 2015-09-18 LAB — CBC
HEMATOCRIT: 43.2 % (ref 40.0–52.0)
HEMOGLOBIN: 14.3 g/dL (ref 13.0–18.0)
MCH: 29.1 pg (ref 26.0–34.0)
MCHC: 33 g/dL (ref 32.0–36.0)
MCV: 88.2 fL (ref 80.0–100.0)
Platelets: 196 10*3/uL (ref 150–440)
RBC: 4.91 MIL/uL (ref 4.40–5.90)
RDW: 15 % — ABNORMAL HIGH (ref 11.5–14.5)
WBC: 7.2 10*3/uL (ref 3.8–10.6)

## 2015-09-18 MED ORDER — IPRATROPIUM-ALBUTEROL 0.5-2.5 (3) MG/3ML IN SOLN
3.0000 mL | Freq: Once | RESPIRATORY_TRACT | Status: AC
  Administered 2015-09-18: 3 mL via RESPIRATORY_TRACT
  Filled 2015-09-18: qty 3

## 2015-09-18 MED ORDER — LISINOPRIL 10 MG PO TABS: 10.0000 mg | ORAL_TABLET | Freq: Every day | ORAL | Status: AC

## 2015-09-18 MED ORDER — ALBUTEROL SULFATE HFA 108 (90 BASE) MCG/ACT IN AERS: 2.0000 | INHALATION_SPRAY | Freq: Four times a day (QID) | RESPIRATORY_TRACT | Status: DC | PRN

## 2015-09-18 MED ORDER — PREDNISONE 20 MG PO TABS: 40.0000 mg | ORAL_TABLET | Freq: Every day | ORAL | Status: DC

## 2015-09-18 MED ORDER — METHYLPREDNISOLONE SODIUM SUCC 125 MG IJ SOLR
125.0000 mg | Freq: Once | INTRAMUSCULAR | Status: AC
  Administered 2015-09-18: 125 mg via INTRAVENOUS
  Filled 2015-09-18: qty 2
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
## 2015-09-18 NOTE — Discharge Instructions (Signed)
Chronic Obstructive Pulmonary Disease Chronic obstructive pulmonary disease (COPD) is a common lung condition in which airflow from the lungs is limited. COPD is a general term that can be used to describe many different lung problems that limit airflow, including both chronic bronchitis and emphysema. If you have COPD, your lung function will probably never return to normal, but there are measures you can take to improve lung function and make yourself feel better. CAUSES   Smoking (common).  Exposure to secondhand smoke.  Genetic problems.  Chronic inflammatory lung diseases or recurrent infections. SYMPTOMS  Shortness of breath, especially with physical activity.  Deep, persistent (chronic) cough with a large amount of thick mucus.  Wheezing.  Rapid breaths (tachypnea).  Gray or bluish discoloration (cyanosis) of the skin, especially in your fingers, toes, or lips.  Fatigue.  Weight loss.  Frequent infections or episodes when breathing symptoms become much worse (exacerbations).  Chest tightness. DIAGNOSIS Your health care provider will take a medical history and perform a physical examination to diagnose COPD. Additional tests for COPD may include:  Lung (pulmonary) function tests.  Chest X-ray.  CT scan.  Blood tests. TREATMENT  Treatment for COPD may include:  Inhaler and nebulizer medicines. These help manage the symptoms of COPD and make your breathing more comfortable.  Supplemental oxygen. Supplemental oxygen is only helpful if you have a low oxygen level in your blood.  Exercise and physical activity. These are beneficial for nearly all people with COPD.  Lung surgery or transplant.  Nutrition therapy to gain weight, if you are underweight.  Pulmonary rehabilitation. This may involve working with a team of health care providers and specialists, such as respiratory, occupational, and physical therapists. HOME CARE INSTRUCTIONS  Take all medicines  (inhaled or pills) as directed by your health care provider.  Avoid over-the-counter medicines or cough syrups that dry up your airway (such as antihistamines) and slow down the elimination of secretions unless instructed otherwise by your health care provider.  If you are a smoker, the most important thing that you can do is stop smoking. Continuing to smoke will cause further lung damage and breathing trouble. Ask your health care provider for help with quitting smoking. He or she can direct you to community resources or hospitals that provide support.  Avoid exposure to irritants such as smoke, chemicals, and fumes that aggravate your breathing.  Use oxygen therapy and pulmonary rehabilitation if directed by your health care provider. If you require home oxygen therapy, ask your health care provider whether you should purchase a pulse oximeter to measure your oxygen level at home.  Avoid contact with individuals who have a contagious illness.  Avoid extreme temperature and humidity changes.  Eat healthy foods. Eating smaller, more frequent meals and resting before meals may help you maintain your strength.  Stay active, but balance activity with periods of rest. Exercise and physical activity will help you maintain your ability to do things you want to do.  Preventing infection and hospitalization is very important when you have COPD. Make sure to receive all the vaccines your health care provider recommends, especially the pneumococcal and influenza vaccines. Ask your health care provider whether you need a pneumonia vaccine.  Learn and use relaxation techniques to manage stress.  Learn and use controlled breathing techniques as directed by your health care provider. Controlled breathing techniques include:  Pursed lip breathing. Start by breathing in (inhaling) through your nose for 1 second. Then, purse your lips as if you were   going to whistle and breathe out (exhale) through the  pursed lips for 2 seconds.  Diaphragmatic breathing. Start by putting one hand on your abdomen just above your waist. Inhale slowly through your nose. The hand on your abdomen should move out. Then purse your lips and exhale slowly. You should be able to feel the hand on your abdomen moving in as you exhale.  Learn and use controlled coughing to clear mucus from your lungs. Controlled coughing is a series of short, progressive coughs. The steps of controlled coughing are: 1. Lean your head slightly forward. 2. Breathe in deeply using diaphragmatic breathing. 3. Try to hold your breath for 3 seconds. 4. Keep your mouth slightly open while coughing twice. 5. Spit any mucus out into a tissue. 6. Rest and repeat the steps once or twice as needed. SEEK MEDICAL CARE IF:  You are coughing up more mucus than usual.  There is a change in the color or thickness of your mucus.  Your breathing is more labored than usual.  Your breathing is faster than usual. SEEK IMMEDIATE MEDICAL CARE IF:  You have shortness of breath while you are resting.  You have shortness of breath that prevents you from:  Being able to talk.  Performing your usual physical activities.  You have chest pain lasting longer than 5 minutes.  Your skin color is more cyanotic than usual.  You measure low oxygen saturations for longer than 5 minutes with a pulse oximeter. MAKE SURE YOU:  Understand these instructions.  Will watch your condition.  Will get help right away if you are not doing well or get worse.   This information is not intended to replace advice given to you by your health care provider. Make sure you discuss any questions you have with your health care provider.   Document Released: 12/27/2004 Document Revised: 04/09/2014 Document Reviewed: 11/13/2012 Elsevier Interactive Patient Education 2016 [employer]  

## 2015-09-18 NOTE — ED Notes (Signed)
Patient given 2 duonebs by EMS.
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
## 2017-09-04 ENCOUNTER — Emergency Department: Payer: Medicare HMO

## 2017-09-04 ENCOUNTER — Emergency Department
Admission: EM | Admit: 2017-09-04 | Discharge: 2017-09-04 | Disposition: A | Discharge status: Home / Self Care | Payer: Medicare HMO | Attending: Student in an Organized Health Care Education/Training Program | Admitting: Student in an Organized Health Care Education/Training Program

## 2017-09-04 ENCOUNTER — Other Ambulatory Visit: Payer: Self-pay

## 2017-09-04 DIAGNOSIS — Z8673 Personal history of transient ischemic attack (TIA), and cerebral infarction without residual deficits: Secondary | ICD-10-CM | POA: Insufficient documentation

## 2017-09-04 DIAGNOSIS — J441 Chronic obstructive pulmonary disease with (acute) exacerbation: Secondary | ICD-10-CM

## 2017-09-04 DIAGNOSIS — R0602 Shortness of breath: Secondary | ICD-10-CM | POA: Diagnosis present

## 2017-09-04 DIAGNOSIS — I10 Essential (primary) hypertension: Secondary | ICD-10-CM | POA: Diagnosis not present

## 2017-09-04 DIAGNOSIS — Z79899 Other long term (current) drug therapy: Secondary | ICD-10-CM | POA: Diagnosis not present

## 2017-09-04 DIAGNOSIS — F17210 Nicotine dependence, cigarettes, uncomplicated: Secondary | ICD-10-CM | POA: Diagnosis not present

## 2017-09-04 LAB — CBC WITH DIFFERENTIAL/PLATELET
BASOS ABS: 0 10*3/uL (ref 0–0.1)
Basophils Relative: 0 %
EOS ABS: 0.4 10*3/uL (ref 0–0.7)
Eosinophils Relative: 4 %
HEMATOCRIT: 40.4 % (ref 40.0–52.0)
HEMOGLOBIN: 13.7 g/dL (ref 13.0–18.0)
Lymphocytes Relative: 23 %
Lymphs Abs: 2.2 10*3/uL (ref 1.0–3.6)
MCH: 29.9 pg (ref 26.0–34.0)
MCHC: 33.9 g/dL (ref 32.0–36.0)
MCV: 88.1 fL (ref 80.0–100.0)
MONOS PCT: 8 %
Monocytes Absolute: 0.8 10*3/uL (ref 0.2–1.0)
NEUTROS ABS: 6.1 10*3/uL (ref 1.4–6.5)
NEUTROS PCT: 65 %
Platelets: 266 10*3/uL (ref 150–440)
RBC: 4.58 MIL/uL (ref 4.40–5.90)
RDW: 14.9 % — AB (ref 11.5–14.5)
WBC: 9.5 10*3/uL (ref 3.8–10.6)

## 2017-09-04 LAB — COMPREHENSIVE METABOLIC PANEL
ALBUMIN: 4.1 g/dL (ref 3.5–5.0)
ALK PHOS: 56 U/L (ref 38–126)
ALT: 17 U/L (ref 17–63)
ANION GAP: 9 (ref 5–15)
AST: 23 U/L (ref 15–41)
BUN: 19 mg/dL (ref 6–20)
CO2: 25 mmol/L (ref 22–32)
Calcium: 8.8 mg/dL — ABNORMAL LOW (ref 8.9–10.3)
Chloride: 103 mmol/L (ref 101–111)
Creatinine, Ser: 1.29 mg/dL — ABNORMAL HIGH (ref 0.61–1.24)
GFR calc Af Amer: >60 mL/min (ref >60)
GFR calc non Af Amer: 55 mL/min — ABNORMAL LOW (ref >60)
GLUCOSE: 97 mg/dL (ref 65–99)
POTASSIUM: 4.2 mmol/L (ref 3.5–5.1)
SODIUM: 137 mmol/L (ref 135–145)
Total Bilirubin: 0.6 mg/dL (ref 0.3–1.2)
Total Protein: 8.1 g/dL (ref 6.5–8.1)

## 2017-09-04 MED ORDER — TIOTROPIUM BROMIDE MONOHYDRATE 18 MCG IN CAPS: 18.0000 ug | ORAL_CAPSULE | Freq: Every day | RESPIRATORY_TRACT | 1 refills | Status: AC

## 2017-09-04 MED ORDER — PREDNISONE 20 MG PO TABS
60.0000 mg | ORAL_TABLET | Freq: Once | ORAL | Status: AC
  Administered 2017-09-04: 60 mg via ORAL
  Filled 2017-09-04: qty 3

## 2017-09-04 MED ORDER — IPRATROPIUM-ALBUTEROL 0.5-2.5 (3) MG/3ML IN SOLN
RESPIRATORY_TRACT | Status: AC
  Administered 2017-09-04: 10:00:00
  Filled 2017-09-04: qty 6

## 2017-09-04 MED ORDER — ALBUTEROL SULFATE HFA 108 (90 BASE) MCG/ACT IN AERS: 2.0000 | INHALATION_SPRAY | Freq: Four times a day (QID) | RESPIRATORY_TRACT | 1 refills | Status: DC | PRN

## 2017-09-04 MED ORDER — PREDNISONE 20 MG PO TABS: 40.0000 mg | ORAL_TABLET | Freq: Every day | ORAL | 0 refills | Status: AC

## 2017-09-04 MED ORDER — ALBUTEROL SULFATE (2.5 MG/3ML) 0.083% IN NEBU
2.5000 mg | INHALATION_SOLUTION | Freq: Once | RESPIRATORY_TRACT | Status: DC
  Filled 2017-09-04: qty 3

## 2017-09-04 NOTE — ED Notes (Signed)
Pt maintaining 94-95% pulse O2 with ambulation.

## 2017-09-04 NOTE — Discharge Instructions (Addendum)
Go to pharmacy to get inhaler and prednisone filled immediately.  Be sure to use your rescue inhaler every 4-6 hours while awake for the next 2 to 3 days.  Return to the ER for any additional questions or concerns or for any worsening symptoms.

## 2017-09-04 NOTE — ED Triage Notes (Signed)
Pt arrived via [facility] from home with c/o shortness of breath and difficulty breathing. EMS states pt was wheezing on arrival and had O2 sat upper 80s and gave duoneb and albuterol treatment. Upon arrival to ED, no wheezing noted. O2 sat 99 on 2L [place]. Pt states out of inhaler.

## 2017-09-04 NOTE — ED Notes (Signed)
Patient transported to X-ray 
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
## 2017-11-19 ENCOUNTER — Ambulatory Visit (INDEPENDENT_AMBULATORY_CARE_PROVIDER_SITE_OTHER): Payer: Medicare HMO | Admitting: Urology

## 2017-11-19 ENCOUNTER — Encounter: Payer: Self-pay | Admitting: Urology

## 2017-11-19 VITALS — BP 110/65 | HR 66 | Ht 71.0 in | Wt 185.0 lb

## 2017-11-19 DIAGNOSIS — R972 Elevated prostate specific antigen [PSA]: Secondary | ICD-10-CM

## 2017-11-19 LAB — URINALYSIS, COMPLETE
Bilirubin, UA: NEGATIVE
Glucose, UA: NEGATIVE
Ketones, UA: NEGATIVE
LEUKOCYTES UA: NEGATIVE
Nitrite, UA: NEGATIVE
PH UA: 5 (ref 5.0–7.5)
PROTEIN UA: NEGATIVE
RBC, UA: NEGATIVE
Specific Gravity, UA: 1.025 (ref 1.005–1.030)
Urobilinogen, Ur: 0.2 mg/dL (ref 0.2–1.0)

## 2017-11-19 LAB — MICROSCOPIC EXAMINATION
BACTERIA UA: NONE SEEN
RBC, UA: NONE SEEN /hpf (ref 0–2)
WBC UA: NONE SEEN /HPF (ref 0–5)
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
## 2017-11-19 NOTE — Patient Instructions (Signed)

## 2017-11-20 ENCOUNTER — Telehealth: Payer: Self-pay | Admitting: Family Medicine

## 2017-11-20 LAB — PSA: Prostate Specific Ag, Serum: 8.8 ng/mL — ABNORMAL HIGH (ref 0.0–4.0)

## 2017-11-20 NOTE — Telephone Encounter (Signed)
LMOM for patient to return call.

## 2017-11-20 NOTE — Telephone Encounter (Signed)
-----   Message from Vanna ScotlandAshley Brandon, MD sent at 11/20/2017  7:40 AM EDT ----- PSA still markedly elevated.  Plan for prostate biopsy as discussed in the office.  Vanna ScotlandAshley Brandon, MD

## 2017-11-22 NOTE — Telephone Encounter (Signed)
Left message for pt to call office

## 2017-11-25 NOTE — Telephone Encounter (Signed)
Patient's wife notified, patient is to keep biopsy apt and was previously given instructions in the office

## 2017-12-27 ENCOUNTER — Other Ambulatory Visit: Payer: Self-pay | Admitting: Urology

## 2017-12-27 ENCOUNTER — Ambulatory Visit (INDEPENDENT_AMBULATORY_CARE_PROVIDER_SITE_OTHER): Payer: Medicare HMO | Admitting: Urology

## 2017-12-27 ENCOUNTER — Encounter: Payer: Self-pay | Admitting: Urology

## 2017-12-27 VITALS — BP 129/78 | HR 67 | Resp 16 | Ht 71.0 in | Wt 189.8 lb

## 2017-12-27 DIAGNOSIS — R972 Elevated prostate specific antigen [PSA]: Secondary | ICD-10-CM

## 2017-12-27 MED ORDER — LIDOCAINE HCL URETHRAL/MUCOSAL 2 % EX GEL
1.0000 "application " | Freq: Once | CUTANEOUS | Status: AC
  Administered 2017-12-27: 1 via URETHRAL

## 2017-12-27 MED ORDER — LEVOFLOXACIN 500 MG PO TABS
500.0000 mg | ORAL_TABLET | Freq: Once | ORAL | Status: AC
  Administered 2017-12-27: 500 mg via ORAL

## 2017-12-27 MED ORDER — GENTAMICIN SULFATE 40 MG/ML IJ SOLN
80.0000 mg | Freq: Once | INTRAMUSCULAR | Status: AC
  Administered 2017-12-27: 80 mg via INTRAMUSCULAR

## 2017-12-27 NOTE — Progress Notes (Signed)
   12/27/17  CC:  Chief Complaint  Patient presents with  . Biopsy    HPI: 67 year old male with elevated PSA to 7.4.  Is also found to have a slightly asymmetric prostate, right greater than left with a left-sided rubbery lateral ridge.  Blood pressure 129/78, pulse 67, resp. rate 16, height 5\' 11"  (1.803 m), weight 189 lb 12.8 oz (86.1 kg). NED. A&Ox3.   No respiratory distress   Abd soft, NT, ND Normal sphincter tone  Prostate Biopsy Procedure   Informed consent was obtained after discussing risks/benefits of the procedure.  A time out was performed to ensure correct patient identity.  Pre-Procedure: - Gentamicin given prophylactically - Levaquin 500 mg administered PO -Transrectal Ultrasound performed revealing a 35 gm prostate -Nodule at right mid prostate gland appreciated, biopsied x 2  Procedure: - Prostate block performed using 10 cc 1% lidocaine and biopsies taken from sextant areas, a total of 12 under ultrasound guidance.  Post-Procedure: - Patient tolerated the procedure well - He was counseled to seek immediate medical attention if experiences any severe pain, significant bleeding, or fevers - Return in one to two weeks to discuss biopsy results   Vanna Scotland, MD

## 2018-01-06 ENCOUNTER — Telehealth: Payer: Self-pay | Admitting: Urology

## 2018-01-06 NOTE — Telephone Encounter (Signed)
patient's pathology was sent to [facility] for further testing per [facility] 01-03-18. His follow up app is 01-14-18  Just Roetta Sessions

## 2018-01-10 ENCOUNTER — Other Ambulatory Visit: Payer: Self-pay | Admitting: Urology

## 2018-01-10 ENCOUNTER — Ambulatory Visit: Payer: Medicare HMO | Admitting: Urology

## 2018-01-10 LAB — PATHOLOGY REPORT

## 2018-01-14 ENCOUNTER — Ambulatory Visit (INDEPENDENT_AMBULATORY_CARE_PROVIDER_SITE_OTHER): Payer: Medicare HMO | Admitting: Urology

## 2018-01-14 ENCOUNTER — Encounter: Payer: Self-pay | Admitting: Urology

## 2018-01-14 VITALS — BP 132/78 | HR 71 | Resp 16 | Ht 71.0 in | Wt 175.1 lb

## 2018-01-14 DIAGNOSIS — R972 Elevated prostate specific antigen [PSA]: Secondary | ICD-10-CM
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
## 2018-03-15 ENCOUNTER — Other Ambulatory Visit: Payer: Self-pay

## 2018-03-15 ENCOUNTER — Emergency Department: Payer: Medicare HMO

## 2018-03-15 ENCOUNTER — Emergency Department
Admission: EM | Admit: 2018-03-15 | Discharge: 2018-03-15 | Disposition: A | Discharge status: Home / Self Care | Payer: Medicare HMO | Attending: Emergency Medicine | Admitting: Emergency Medicine

## 2018-03-15 DIAGNOSIS — F17210 Nicotine dependence, cigarettes, uncomplicated: Secondary | ICD-10-CM | POA: Diagnosis not present

## 2018-03-15 DIAGNOSIS — J441 Chronic obstructive pulmonary disease with (acute) exacerbation: Secondary | ICD-10-CM | POA: Diagnosis not present

## 2018-03-15 DIAGNOSIS — I10 Essential (primary) hypertension: Secondary | ICD-10-CM | POA: Diagnosis not present

## 2018-03-15 DIAGNOSIS — J45909 Unspecified asthma, uncomplicated: Secondary | ICD-10-CM | POA: Diagnosis present

## 2018-03-15 MED ORDER — IPRATROPIUM-ALBUTEROL 0.5-2.5 (3) MG/3ML IN SOLN
3.0000 mL | Freq: Once | RESPIRATORY_TRACT | Status: AC
Start: 2018-03-15 — End: 2018-03-15
  Administered 2018-03-15: 3 mL via RESPIRATORY_TRACT
  Filled 2018-03-15: qty 3

## 2018-03-15 MED ORDER — ALBUTEROL SULFATE (2.5 MG/3ML) 0.083% IN NEBU
5.0000 mg | INHALATION_SOLUTION | Freq: Once | RESPIRATORY_TRACT | Status: AC
  Administered 2018-03-15: 5 mg via RESPIRATORY_TRACT
  Filled 2018-03-15: qty 6

## 2018-03-15 MED ORDER — ALBUTEROL SULFATE (2.5 MG/3ML) 0.083% IN NEBU: 2.5000 mg | INHALATION_SOLUTION | Freq: Four times a day (QID) | RESPIRATORY_TRACT | 1 refills | Status: AC | PRN

## 2018-03-15 MED ORDER — DEXAMETHASONE SODIUM PHOSPHATE 10 MG/ML IJ SOLN
10.0000 mg | Freq: Once | INTRAMUSCULAR | Status: AC
  Administered 2018-03-15: 10 mg via INTRAMUSCULAR
  Filled 2018-03-15: qty 1

## 2018-03-15 MED ORDER — PREDNISONE 10 MG PO TABS: ORAL_TABLET | ORAL | 0 refills | Status: AC

## 2018-03-15 MED ORDER — ALBUTEROL SULFATE HFA 108 (90 BASE) MCG/ACT IN AERS: 2.0000 | INHALATION_SPRAY | Freq: Four times a day (QID) | RESPIRATORY_TRACT | 1 refills | Status: AC | PRN

## 2018-03-15 MED ORDER — IPRATROPIUM-ALBUTEROL 0.5-2.5 (3) MG/3ML IN SOLN
3.0000 mL | Freq: Once | RESPIRATORY_TRACT | Status: AC
  Administered 2018-03-15: 3 mL via RESPIRATORY_TRACT
  Filled 2018-03-15: qty 3

## 2018-03-15 NOTE — ED Triage Notes (Signed)
Here for asthma. States has been using inhaler. Expiratory wheezing noted.
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
## 2018-03-15 NOTE — Discharge Instructions (Signed)
Follow-up with your primary care provider next week.  Call make an appointment.  Also begin taking medication as directed.  Continue your albuterol inhaler as needed and also a prescription for your albuterol nebulizer solution was written as well.  Prednisone should be taken beginning tomorrow as directed.  Return to the emergency department if any severe worsening of your symptoms.

## 2018-07-21 ENCOUNTER — Other Ambulatory Visit: Payer: Medicare HMO

## 2018-07-23 ENCOUNTER — Ambulatory Visit: Payer: Medicare HMO | Admitting: Urology

## 2018-09-30 ENCOUNTER — Other Ambulatory Visit: Payer: Medicare HMO

## 2018-09-30 ENCOUNTER — Encounter: Payer: Self-pay | Admitting: Urology

## 2018-10-08 ENCOUNTER — Encounter: Payer: Self-pay | Admitting: Urology

## 2018-10-08 ENCOUNTER — Ambulatory Visit: Payer: Medicare HMO | Admitting: Urology

## 2018-11-01 DEATH — deceased

## 2018-11-19 ENCOUNTER — Other Ambulatory Visit: Payer: Medicare HMO

## 2018-11-19 ENCOUNTER — Encounter: Payer: Self-pay | Admitting: Urology

## 2018-11-26 ENCOUNTER — Ambulatory Visit: Payer: Medicare HMO | Admitting: Urology

## 2018-11-27 ENCOUNTER — Encounter: Payer: Self-pay | Admitting: Urology

## 2019-07-21 IMAGING — CR DG CHEST 2V
1 series · 2 of 2 positions shown · non-contrast
Comparison: 09/04/2017

CLINICAL DATA: Read active cough for 3-4 days, expiratory wheezing,
history asthma, smoking, COPD

EXAM:
CHEST - 2 VIEW

[Series 1: dg chest 2 view · 0.14mm/px · 2 of 2 slices shown]
[im 1/2]
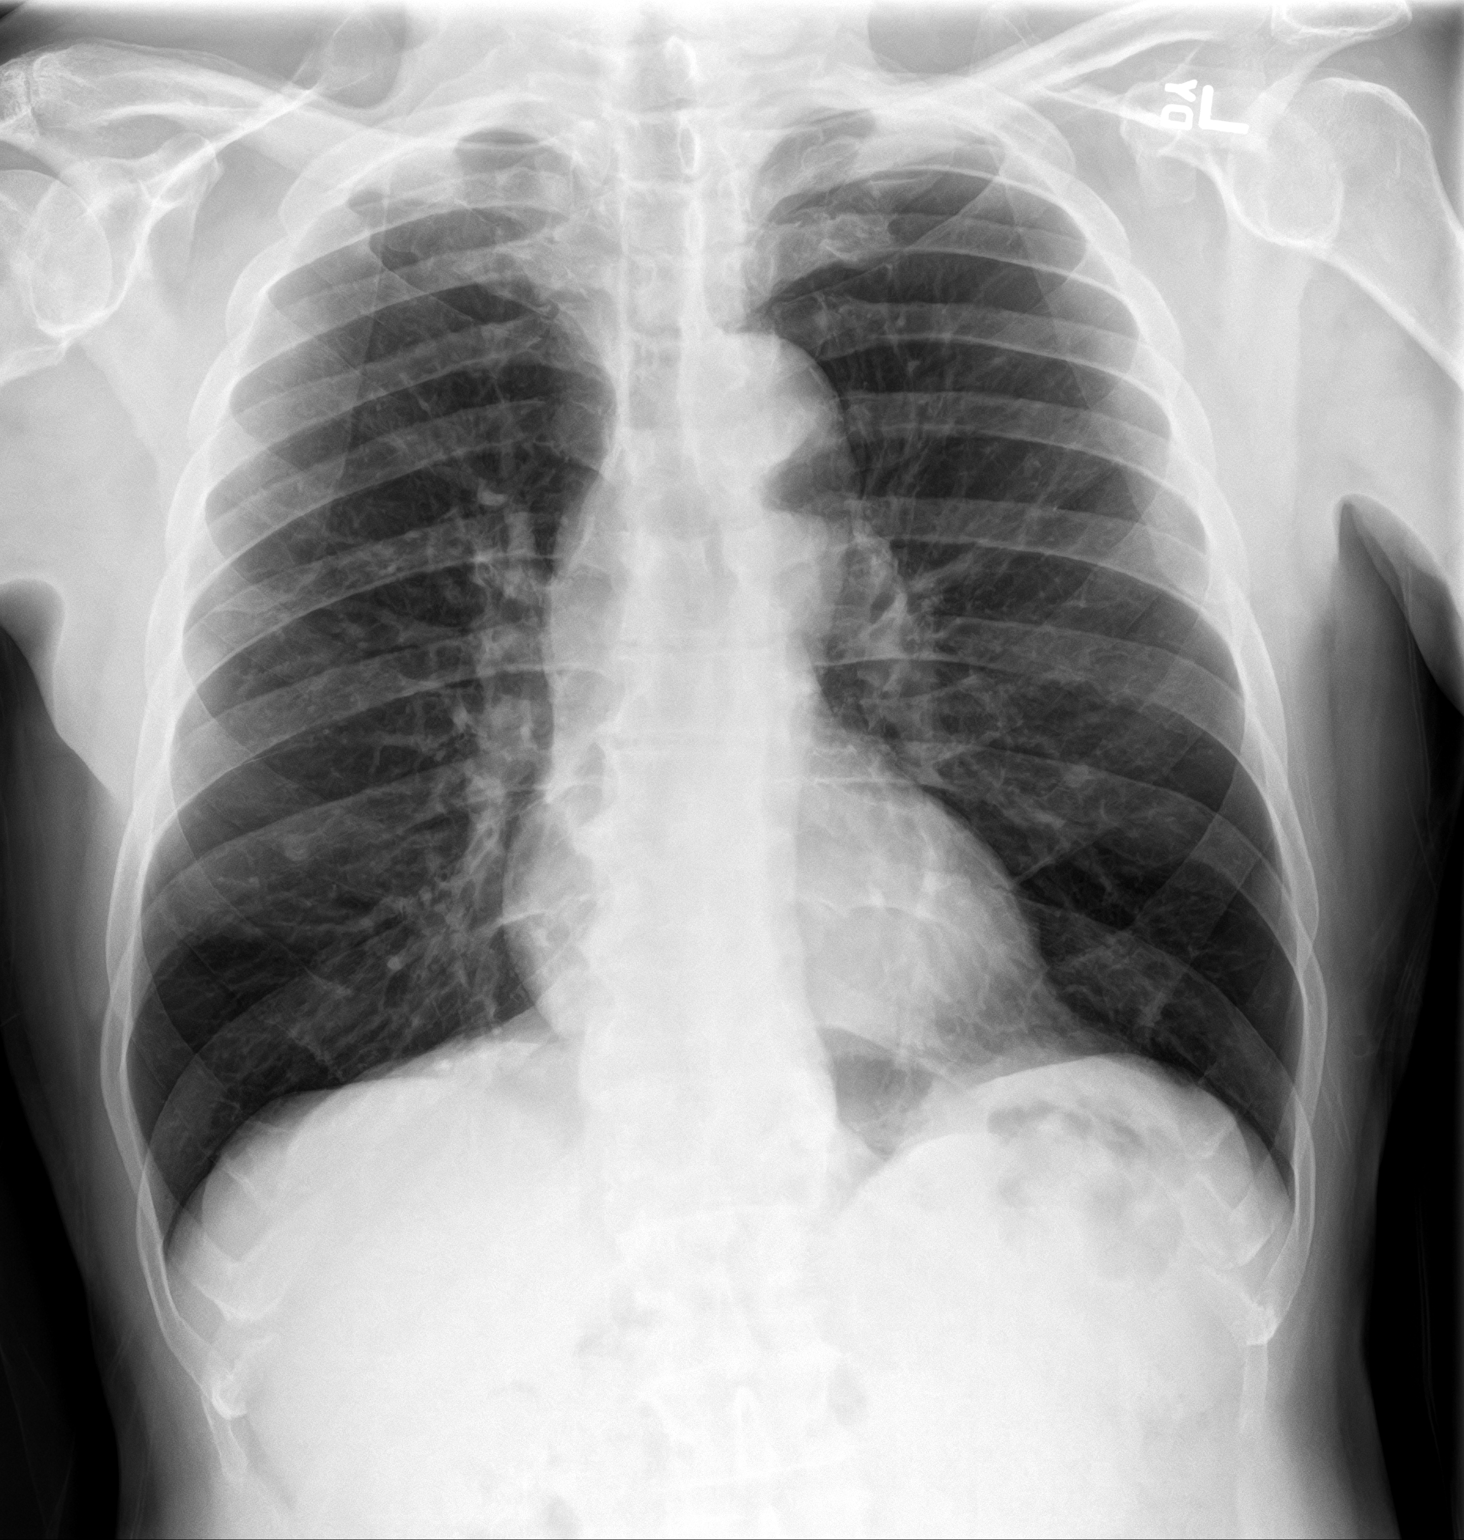
[im 2/2]
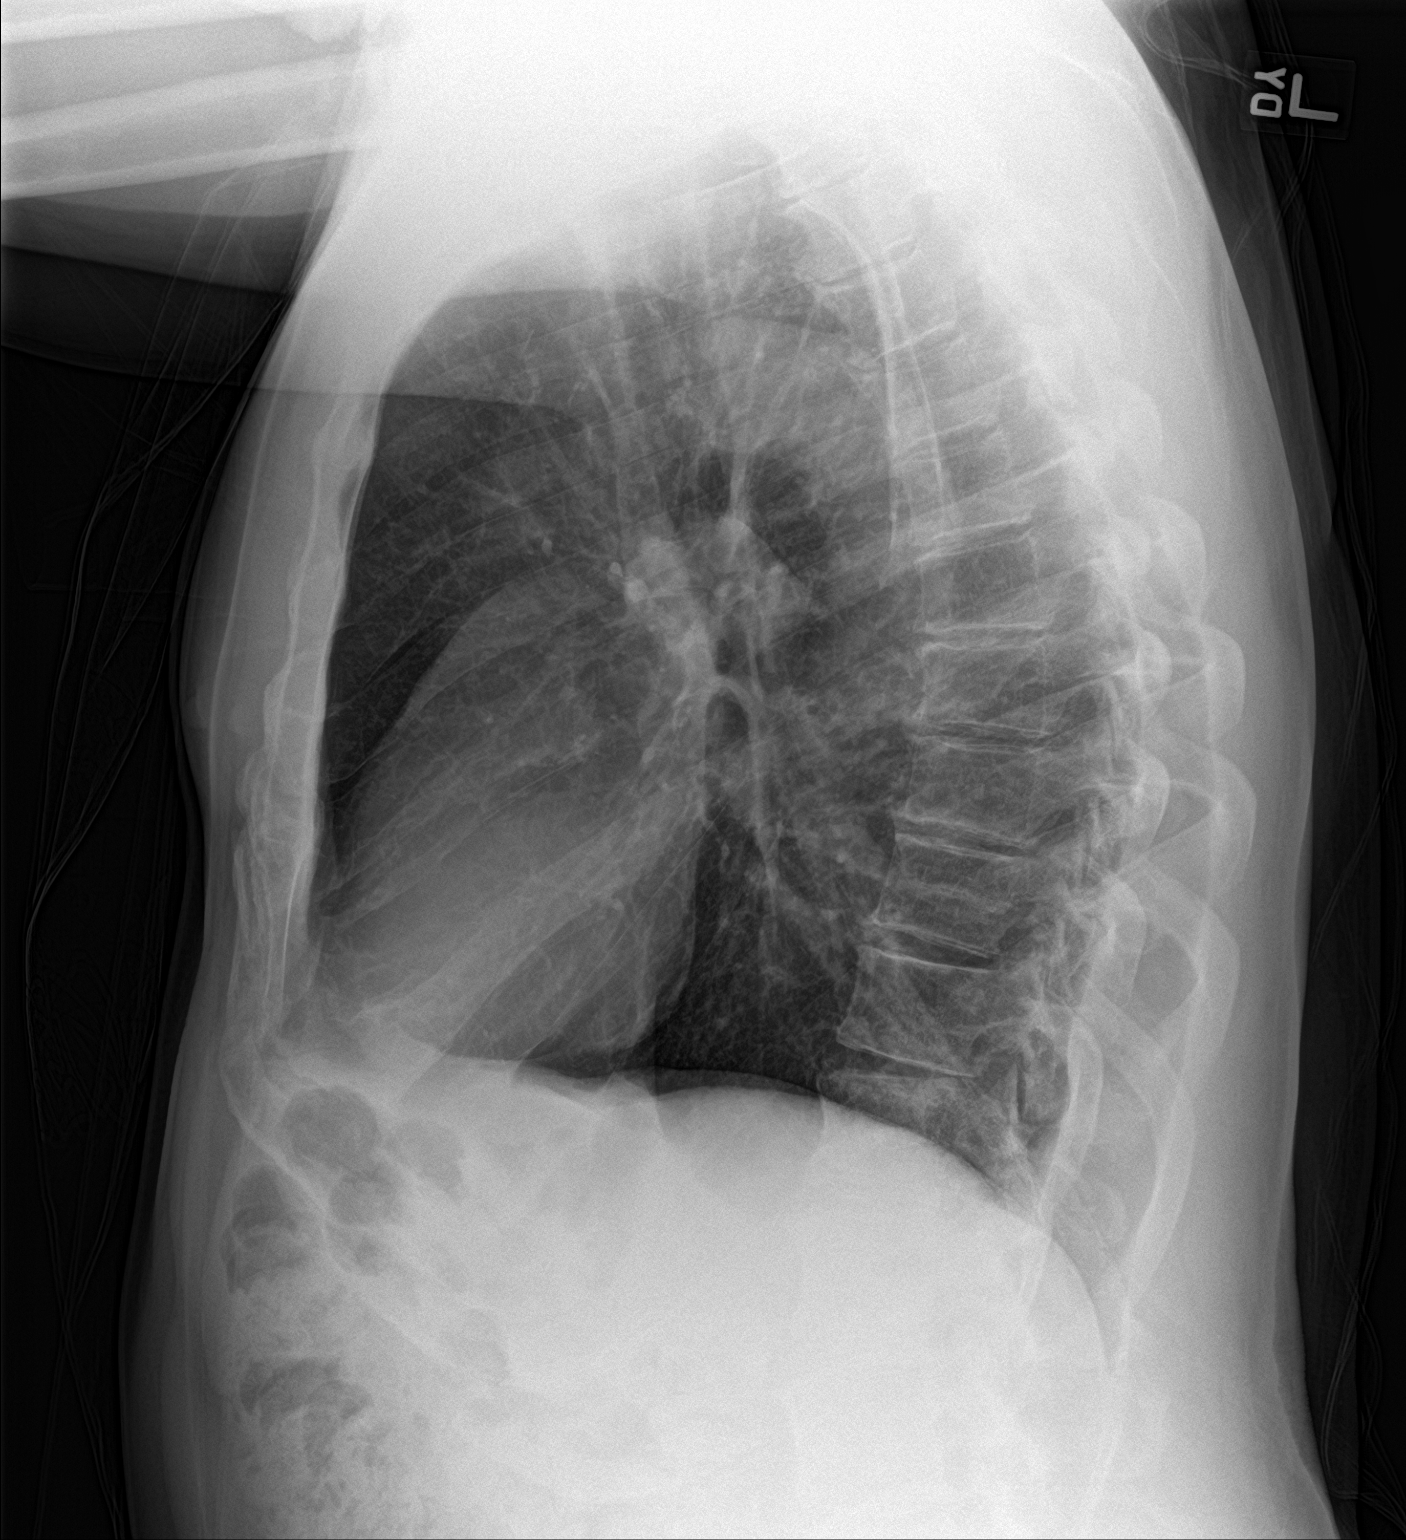

[2 of 2 positions shown; findings below may reference images not displayed]

FINDINGS: Normal heart size, mediastinal contours, and pulmonary vascularity.

BILATERAL nipple shadows again seen, unchanged since 02/26/2015.

Minimal central peribronchial thickening and hyperinflation,
chronic, consistent with history of asthma and COPD.

No acute infiltrate, pleural effusion, or pneumothorax.

Bones unremarkable.
IMPRESSION: Bronchitic changes and hyperinflation consistent with history of
asthma and COPD.

No acute abnormalities.
